# Patient Record
Sex: Female | Born: 1985 | Race: White | Hispanic: No | Marital: Single | Smoking: Current every day smoker
Health system: Southern US, Community
[De-identification: ages and names within clinical notes are randomized; demographics above are authoritative.]

## PROBLEM LIST (undated history)

## (undated) DIAGNOSIS — M5432 Sciatica, left side: Secondary | ICD-10-CM

## (undated) DIAGNOSIS — F840 Autistic disorder: Secondary | ICD-10-CM

## (undated) DIAGNOSIS — J45909 Unspecified asthma, uncomplicated: Secondary | ICD-10-CM

---

## 2015-05-14 ENCOUNTER — Emergency Department (HOSPITAL_COMMUNITY): Payer: Medicaid - Out of State

## 2015-05-14 ENCOUNTER — Encounter (HOSPITAL_COMMUNITY): Payer: Self-pay | Admitting: *Deleted

## 2015-05-14 ENCOUNTER — Emergency Department (HOSPITAL_COMMUNITY)
Admission: EM | Admit: 2015-05-14 | Discharge: 2015-05-15 | Disposition: A | Discharge status: Home / Self Care | Payer: Medicaid - Out of State | Attending: Emergency Medicine | Admitting: Emergency Medicine

## 2015-05-14 DIAGNOSIS — R109 Unspecified abdominal pain: Secondary | ICD-10-CM | POA: Insufficient documentation

## 2015-05-14 DIAGNOSIS — M545 Low back pain, unspecified: Secondary | ICD-10-CM

## 2015-05-14 DIAGNOSIS — Y999 Unspecified external cause status: Secondary | ICD-10-CM | POA: Diagnosis not present

## 2015-05-14 DIAGNOSIS — Y929 Unspecified place or not applicable: Secondary | ICD-10-CM | POA: Diagnosis not present

## 2015-05-14 DIAGNOSIS — Y939 Activity, unspecified: Secondary | ICD-10-CM | POA: Insufficient documentation

## 2015-05-14 DIAGNOSIS — Z7951 Long term (current) use of inhaled steroids: Secondary | ICD-10-CM | POA: Diagnosis not present

## 2015-05-14 DIAGNOSIS — M79671 Pain in right foot: Secondary | ICD-10-CM

## 2015-05-14 DIAGNOSIS — F17200 Nicotine dependence, unspecified, uncomplicated: Secondary | ICD-10-CM | POA: Insufficient documentation

## 2015-05-14 DIAGNOSIS — M549 Dorsalgia, unspecified: Secondary | ICD-10-CM | POA: Diagnosis present

## 2015-05-14 HISTORY — DX: Autistic disorder: F84.0

## 2015-05-14 HISTORY — DX: Sciatica, left side: M54.32

## 2015-05-14 LAB — CBC
HEMATOCRIT: 36.3 % (ref 36.0–46.0)
HEMOGLOBIN: 12 g/dL (ref 12.0–15.0)
MCH: 29.7 pg (ref 26.0–34.0)
MCHC: 33.1 g/dL (ref 30.0–36.0)
MCV: 89.9 fL (ref 78.0–100.0)
Platelets: 266 10*3/uL (ref 150–400)
RBC: 4.04 MIL/uL (ref 3.87–5.11)
RDW: 13.6 % (ref 11.5–15.5)
WBC: 7.4 10*3/uL (ref 4.0–10.5)

## 2015-05-14 LAB — COMPREHENSIVE METABOLIC PANEL
ALT: 12 U/L — ABNORMAL LOW (ref 14–54)
ANION GAP: 8 (ref 5–15)
AST: 11 U/L — AB (ref 15–41)
Albumin: 4.2 g/dL (ref 3.5–5.0)
Alkaline Phosphatase: 55 U/L (ref 38–126)
BILIRUBIN TOTAL: 0 mg/dL — AB (ref 0.3–1.2)
BUN: 11 mg/dL (ref 6–20)
CALCIUM: 8.9 mg/dL (ref 8.9–10.3)
CO2: 26 mmol/L (ref 22–32)
Chloride: 105 mmol/L (ref 101–111)
Creatinine, Ser: 0.78 mg/dL (ref 0.44–1.00)
GFR calc Af Amer: >60 mL/min (ref >60)
GFR calc non Af Amer: >60 mL/min (ref >60)
Glucose, Bld: 84 mg/dL (ref 65–99)
Potassium: 3.7 mmol/L (ref 3.5–5.1)
SODIUM: 139 mmol/L (ref 135–145)
TOTAL PROTEIN: 7 g/dL (ref 6.5–8.1)

## 2015-05-14 LAB — URINALYSIS, ROUTINE W REFLEX MICROSCOPIC
BILIRUBIN URINE: NEGATIVE
Glucose, UA: NEGATIVE mg/dL
KETONES UR: NEGATIVE mg/dL
Leukocytes, UA: NEGATIVE
NITRITE: NEGATIVE
PH: 6 (ref 5.0–8.0)
SPECIFIC GRAVITY, URINE: 1.015 (ref 1.005–1.030)

## 2015-05-14 LAB — PREGNANCY, URINE: Preg Test, Ur: NEGATIVE

## 2015-05-14 LAB — URINE MICROSCOPIC-ADD ON

## 2015-05-14 LAB — LIPASE, BLOOD: Lipase: 26 U/L (ref 11–51)
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
## 2015-05-15 MED ORDER — DIAZEPAM 5 MG PO TABS
5.0000 mg | ORAL_TABLET | Freq: Once | ORAL | Status: AC
  Administered 2015-05-15: 5 mg via ORAL
  Filled 2015-05-15: qty 1

## 2015-05-15 MED ORDER — CYCLOBENZAPRINE HCL 10 MG PO TABS: 10.0000 mg | ORAL_TABLET | Freq: Three times a day (TID) | ORAL | Status: DC

## 2015-05-15 MED ORDER — ACETAMINOPHEN 500 MG PO TABS
1000.0000 mg | ORAL_TABLET | Freq: Once | ORAL | Status: AC
  Administered 2015-05-15: 250 mg via ORAL
  Filled 2015-05-15: qty 2

## 2015-05-15 MED ORDER — PROMETHAZINE HCL 12.5 MG PO TABS: 12.5000 mg | ORAL_TABLET | Freq: Four times a day (QID) | ORAL | Status: DC | PRN

## 2015-05-15 MED ORDER — TIZANIDINE HCL 4 MG PO CAPS: 4.0000 mg | ORAL_CAPSULE | Freq: Two times a day (BID) | ORAL | Status: DC

## 2015-05-15 MED ORDER — DEXAMETHASONE 4 MG PO TABS: 4.0000 mg | ORAL_TABLET | Freq: Two times a day (BID) | ORAL | Status: DC

## 2015-05-15 MED ORDER — CYCLOBENZAPRINE HCL 10 MG PO TABS
10.0000 mg | ORAL_TABLET | Freq: Once | ORAL | Status: DC
  Filled 2015-05-15: qty 1

## 2015-05-15 MED ORDER — PROMETHAZINE HCL 12.5 MG PO TABS
12.5000 mg | ORAL_TABLET | Freq: Once | ORAL | Status: AC
  Administered 2015-05-15: 12.5 mg via ORAL
  Filled 2015-05-15: qty 1

## 2015-05-15 MED ORDER — CEPHALEXIN 500 MG PO CAPS: 500.0000 mg | ORAL_CAPSULE | Freq: Four times a day (QID) | ORAL | Status: DC

## 2015-05-15 NOTE — Discharge Instructions (Signed)
Your vital signs are well within normal limits. The x-ray of your foot is negative for fracture, dislocation, or effusion. Your labs are negative for acute changes, except for some abnormality of your urine. A culture has been sent to the lab. Please see Dr. August Saucerean, or the orthopedic specialist of your choice for additional evaluation concerning your back and your foot. Please use Flexeril 3 times daily for spasm pain, please use Decadron 2 times daily with a meal for inflammation, and use Tylenol every 4 hours as needed. Use Keflex four times daily for the abnormality of your urine test. Musculoskeletal Pain Musculoskeletal pain is muscle and boney aches and pains. These pains can occur in any part of the body. Your caregiver may treat you without knowing the cause of the pain. They may treat you if blood or urine tests, X-rays, and other tests were normal.  CAUSES There is often not a definite cause or reason for these pains. These pains may be caused by a type of germ (virus). The discomfort may also come from overuse. Overuse includes working out too hard when your body is not fit. Boney aches also come from weather changes. Bone is sensitive to atmospheric pressure changes. HOME CARE INSTRUCTIONS   Ask when your test results will be ready. Make sure you get your test results.  Only take over-the-counter or prescription medicines for pain, discomfort, or fever as directed by your caregiver. If you were given medications for your condition, do not drive, operate machinery or power tools, or sign legal documents for 24 hours. Do not drink alcohol. Do not take sleeping pills or other medications that may interfere with treatment.  Continue all activities unless the activities cause more pain. When the pain lessens, slowly resume normal activities. Gradually increase the intensity and duration of the activities or exercise.  During periods of severe pain, bed rest may be helpful. Lay or sit in any  position that is comfortable.  Putting ice on the injured area.  Put ice in a bag.  Place a towel between your skin and the bag.  Leave the ice on for 15 to 20 minutes, 3 to 4 times a day.  Follow up with your caregiver for continued problems and no reason can be found for the pain. If the pain becomes worse or does not go away, it may be necessary to repeat tests or do additional testing. Your caregiver may need to look further for a possible cause. SEEK IMMEDIATE MEDICAL CARE IF:  You have pain that is getting worse and is not relieved by medications.  You develop chest pain that is associated with shortness or breath, sweating, feeling sick to your stomach (nauseous), or throw up (vomit).  Your pain becomes localized to the abdomen.  You develop any new symptoms that seem different or that concern you. MAKE SURE YOU:   Understand these instructions.  Will watch your condition.  Will get help right away if you are not doing well or get worse.   This information is not intended to replace advice given to you by your health care provider. Make sure you discuss any questions you have with your health care provider.   Document Released: 01/09/2005 Document Revised: 04/03/2011 Document Reviewed: 09/13/2012 Elsevier Interactive Patient Education [employer] Elsevier Inc. as needed.

## 2015-05-15 NOTE — ED Notes (Signed)
EDPA aware that pt has multiple medication questions

## 2015-05-17 LAB — URINE CULTURE: SPECIAL REQUESTS: NORMAL

## 2015-05-23 ENCOUNTER — Encounter (HOSPITAL_COMMUNITY): Payer: Self-pay | Admitting: Emergency Medicine

## 2015-05-23 ENCOUNTER — Emergency Department (HOSPITAL_COMMUNITY)
Admission: EM | Admit: 2015-05-23 | Discharge: 2015-05-23 | Disposition: A | Discharge status: Home / Self Care | Payer: No Typology Code available for payment source | Attending: Emergency Medicine | Admitting: Emergency Medicine

## 2015-05-23 DIAGNOSIS — M5432 Sciatica, left side: Secondary | ICD-10-CM

## 2015-05-23 DIAGNOSIS — F17200 Nicotine dependence, unspecified, uncomplicated: Secondary | ICD-10-CM | POA: Insufficient documentation

## 2015-05-23 DIAGNOSIS — M5442 Lumbago with sciatica, left side: Secondary | ICD-10-CM | POA: Insufficient documentation

## 2015-05-23 MED ORDER — DIAZEPAM 2 MG PO TABS
2.0000 mg | ORAL_TABLET | Freq: Two times a day (BID) | ORAL | Status: AC | PRN
Start: 2015-05-23 — End: ?

## 2015-05-23 MED ORDER — DIAZEPAM 5 MG PO TABS
5.0000 mg | ORAL_TABLET | Freq: Once | ORAL | Status: AC
  Administered 2015-05-23: 5 mg via ORAL
  Filled 2015-05-23: qty 1

## 2015-05-23 MED ORDER — DIAZEPAM 2 MG PO TABS: 2.0000 mg | ORAL_TABLET | Freq: Four times a day (QID) | ORAL | Status: DC | PRN

## 2015-05-23 MED ORDER — PREDNISONE 20 MG PO TABS
40.0000 mg | ORAL_TABLET | Freq: Once | ORAL | Status: AC
  Administered 2015-05-23: 40 mg via ORAL
  Filled 2015-05-23: qty 2

## 2015-05-23 NOTE — Discharge Instructions (Signed)
Get your prescription for the prednisone filled and follow up with Dr. Romeo Apple.  Sciatica Sciatica is pain, weakness, numbness, or tingling along the path of the sciatic nerve. The nerve starts in the lower back and runs down the back of each leg. The nerve controls the muscles in the lower leg and in the back of the knee, while also providing sensation to the back of the thigh, lower leg, and the sole of your foot. Sciatica is a symptom of another medical condition. For instance, nerve damage or certain conditions, such as a herniated disk or bone spur on the spine, pinch or put pressure on the sciatic nerve. This causes the pain, weakness, or other sensations normally associated with sciatica. Generally, sciatica only affects one side of the body. CAUSES   Herniated or slipped disc.  Degenerative disk disease.  A pain disorder involving the narrow muscle in the buttocks (piriformis syndrome).  Pelvic injury or fracture.  Pregnancy.  Tumor (rare). SYMPTOMS  Symptoms can vary from mild to very severe. The symptoms usually travel from the low back to the buttocks and down the back of the leg. Symptoms can include:  Mild tingling or dull aches in the lower back, leg, or hip.  Numbness in the back of the calf or sole of the foot.  Burning sensations in the lower back, leg, or hip.  Sharp pains in the lower back, leg, or hip.  Leg weakness.  Severe back pain inhibiting movement. These symptoms may get worse with coughing, sneezing, laughing, or prolonged sitting or standing. Also, being overweight may worsen symptoms. DIAGNOSIS  Your caregiver will perform a physical exam to look for common symptoms of sciatica. He or she may ask you to do certain movements or activities that would trigger sciatic nerve pain. Other tests may be performed to find the cause of the sciatica. These may include:  Blood tests.  X-rays.  Imaging tests, such as an MRI or CT scan. TREATMENT  Treatment is  directed at the cause of the sciatic pain. Sometimes, treatment is not necessary and the pain and discomfort goes away on its own. If treatment is needed, your caregiver may suggest:  Over-the-counter medicines to relieve pain.  Prescription medicines, such as anti-inflammatory medicine, muscle relaxants, or narcotics.  Applying heat or ice to the painful area.  Steroid injections to lessen pain, irritation, and inflammation around the nerve.  Reducing activity during periods of pain.  Exercising and stretching to strengthen your abdomen and improve flexibility of your spine. Your caregiver may suggest losing weight if the extra weight makes the back pain worse.  Physical therapy.  Surgery to eliminate what is pressing or pinching the nerve, such as a bone spur or part of a herniated disk. HOME CARE INSTRUCTIONS   Only take over-the-counter or prescription medicines for pain or discomfort as directed by your caregiver.  Apply ice to the affected area for 20 minutes, 3-4 times a day for the first 48-72 hours. Then try heat in the same way.  Exercise, stretch, or perform your usual activities if these do not aggravate your pain.  Attend physical therapy sessions as directed by your caregiver.  Keep all follow-up appointments as directed by your caregiver.  Do not wear high heels or shoes that do not provide proper support.  Check your mattress to see if it is too soft. A firm mattress may lessen your pain and discomfort. SEEK IMMEDIATE MEDICAL CARE IF:   You lose control of your bowel  or bladder (incontinence).  You have increasing weakness in the lower back, pelvis, buttocks, or legs.  You have redness or swelling of your back.  You have a burning sensation when you urinate.  You have pain that gets worse when you lie down or awakens you at night.  Your pain is worse than you have experienced in the past.  Your pain is lasting longer than 4 weeks.  You are suddenly  losing weight without reason. MAKE SURE YOU:  Understand these instructions.  Will watch your condition.  Will get help right away if you are not doing well or get worse.   This information is not intended to replace advice given to you by your health care provider. Make sure you discuss any questions you have with your health care provider.   Document Released: 01/03/2001 Document Revised: 09/30/2014 Document Reviewed: 05/21/2011 Elsevier Interactive Patient Education [employer]2016 Elsevier Inc.

## 2015-05-23 NOTE — ED Notes (Signed)
Pt made aware to return if symptoms worsen or if any life threatening symptoms occur.   
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
## 2015-06-14 ENCOUNTER — Emergency Department (HOSPITAL_COMMUNITY): Payer: Medicaid - Out of State

## 2015-06-14 ENCOUNTER — Encounter (HOSPITAL_COMMUNITY): Payer: Self-pay | Admitting: Emergency Medicine

## 2015-06-14 ENCOUNTER — Emergency Department (HOSPITAL_COMMUNITY)
Admission: EM | Admit: 2015-06-14 | Discharge: 2015-06-14 | Disposition: A | Discharge status: Home / Self Care | Payer: Medicaid - Out of State | Attending: Emergency Medicine | Admitting: Emergency Medicine

## 2015-06-14 DIAGNOSIS — Z79899 Other long term (current) drug therapy: Secondary | ICD-10-CM | POA: Diagnosis not present

## 2015-06-14 DIAGNOSIS — J302 Other seasonal allergic rhinitis: Secondary | ICD-10-CM | POA: Diagnosis not present

## 2015-06-14 DIAGNOSIS — Z79891 Long term (current) use of opiate analgesic: Secondary | ICD-10-CM | POA: Diagnosis not present

## 2015-06-14 DIAGNOSIS — F17210 Nicotine dependence, cigarettes, uncomplicated: Secondary | ICD-10-CM | POA: Diagnosis not present

## 2015-06-14 DIAGNOSIS — J40 Bronchitis, not specified as acute or chronic: Secondary | ICD-10-CM

## 2015-06-14 DIAGNOSIS — R0602 Shortness of breath: Secondary | ICD-10-CM | POA: Diagnosis present

## 2015-06-14 DIAGNOSIS — T7840XA Allergy, unspecified, initial encounter: Secondary | ICD-10-CM

## 2015-06-14 HISTORY — DX: Unspecified asthma, uncomplicated: J45.909

## 2015-06-14 LAB — RAPID URINE DRUG SCREEN, HOSP PERFORMED
Amphetamines: NOT DETECTED
Barbiturates: NOT DETECTED
Benzodiazepines: POSITIVE — AB
Cocaine: NOT DETECTED
OPIATES: NOT DETECTED
Tetrahydrocannabinol: NOT DETECTED

## 2015-06-14 MED ORDER — PREDNISONE 10 MG PO TABS: 40.0000 mg | ORAL_TABLET | Freq: Every day | ORAL | Status: DC

## 2015-06-14 MED ORDER — ACETAMINOPHEN 325 MG PO TABS
650.0000 mg | ORAL_TABLET | Freq: Once | ORAL | Status: AC
  Administered 2015-06-14: 650 mg via ORAL
  Filled 2015-06-14: qty 2

## 2015-06-14 MED ORDER — PREDNISONE 10 MG PO TABS
60.0000 mg | ORAL_TABLET | Freq: Once | ORAL | Status: AC
  Administered 2015-06-14: 60 mg via ORAL
  Filled 2015-06-14: qty 1
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
## 2015-06-14 NOTE — ED Provider Notes (Signed)
CSN: [id number]     Arrival date & time 06/14/15  1112 History   First MD Initiated Contact with Patient 06/14/15 1517     Chief Complaint  Patient presents with  . Shortness of Breath     (Consider location/radiation/quality/duration/timing/severity/associated sxs/prior Treatment) Patient is a 30 y.o. female presenting with shortness of breath. The history is provided by the patient.  Shortness of Breath Associated symptoms: cough   Associated symptoms: no abdominal pain, no chest pain, no fever, no headaches and no rash   Patient presents with shortness of breath and cough feeling like throat is going to close off at times. Patient this morning had the tight feeling in her throat felt short of breath and difficulty breathing. Patient is in a shelter that she thinks has black mold. Patient known to have seasonal allergies. Patient seen a week ago at [facility] started on albuterol inhaler and Claritin but not able to get the Claritin filled. Patient also not using the albuterol inhaler on a regular basis. Patient's been on Flonase in the past for allergy symptoms and she is using that sporadically as well. Patient denies any upper respiratory symptoms no productive cough no fevers. No sore throat. No hives or rash.  Past Medical History  Diagnosis Date  . Sciatica, left side   . Autism spectrum   . Asthma    History reviewed. No pertinent past surgical history. No family history on file. Social History  Substance Use Topics  . Smoking status: Current Every Day Smoker    Types: Cigarettes  . Smokeless tobacco: None  . Alcohol Use: No   OB History    No data available     Review of Systems  Constitutional: Negative for fever.  HENT: Negative for congestion.   Eyes: Negative for visual disturbance.  Respiratory: Positive for cough. Negative for shortness of breath.   Cardiovascular: Negative for chest pain.  Gastrointestinal: Negative for abdominal pain.   Genitourinary: Negative for dysuria.  Musculoskeletal: Negative for back pain.  Skin: Negative for rash.  Allergic/Immunologic: Positive for environmental allergies.  Neurological: Negative for headaches.  Hematological: Does not bruise/bleed easily.  Psychiatric/Behavioral: Negative for confusion.      Allergies  Nsaids  Home Medications   Prior to Admission medications   Medication Sig Start Date End Date Taking? Authorizing Provider  Cholecalciferol (VITAMIN D PO) Take 1 tablet by mouth daily.   Yes Historical Provider, MD  CINNAMON PO Take 1 tablet by mouth daily.   Yes Historical Provider, MD  diazepam (VALIUM) 2 MG tablet Take 1 tablet (2 mg total) by mouth every 12 (twelve) hours as needed for anxiety. 05/23/15  Yes Hope Orlene Och, NP  fluticasone (FLONASE) 50 MCG/ACT nasal spray Place 1 spray into both nostrils daily as needed for allergies.    Yes Historical Provider, MD  HYDROcodone-acetaminophen (NORCO/VICODIN) 5-325 MG tablet Take 1 tablet by mouth every 6 (six) hours as needed for moderate pain. Reported on 06/14/2015   Yes Historical Provider, MD  Melatonin 3 MG CAPS Take 3 mg by mouth at bedtime.   Yes Historical Provider, MD  promethazine (PHENERGAN) 12.5 MG tablet Take 1 tablet (12.5 mg total) by mouth every 6 (six) hours as needed for nausea or vomiting. 05/15/15  Yes Ivery Quale, PA-C  tiZANidine (ZANAFLEX) 4 MG capsule Take 1 capsule (4 mg total) by mouth 2 (two) times daily. Patient taking differently: Take 4 mg by mouth at bedtime.  05/15/15  Yes Ivery Quale, PA-C  VITAMIN A PO Take 1 tablet by mouth daily.   Yes Historical Provider, MD  cephALEXin (KEFLEX) 500 MG capsule Take 1 capsule (500 mg total) by mouth 4 (four) times daily. Patient not taking: Reported on 06/14/2015 05/15/15   Ivery QualeHobson Bryant, PA-C  cyclobenzaprine (FLEXERIL) 10 MG tablet Take 1 tablet (10 mg total) by mouth 3 (three) times daily. Patient not taking: Reported on 06/14/2015 05/15/15   Ivery QualeHobson  Bryant, PA-C  dexamethasone (DECADRON) 4 MG tablet Take 1 tablet (4 mg total) by mouth 2 (two) times daily with a meal. Patient not taking: Reported on 06/14/2015 05/15/15   Ivery QualeHobson Bryant, PA-C  predniSONE (DELTASONE) 10 MG tablet Take 4 tablets (40 mg total) by mouth daily. 06/14/15   Vanetta MuldersScott Khaylee Mcevoy, MD   BP 134/82 mmHg  Pulse 85  Temp(Src) 97.8 F (36.6 C) (Oral)  Resp 16  Ht 5\' 11"  (1.803 m)  Wt 97.07 kg  BMI 29.86 kg/m2  SpO2 99%  LMP 06/12/2015 Physical Exam  Constitutional: She is oriented to person, place, and time. She appears well-developed and well-nourished. No distress.  HENT:  Head: Normocephalic and atraumatic.  Mouth/Throat: Oropharynx is clear and moist.  Eyes: Conjunctivae and EOM are normal. Pupils are equal, round, and reactive to light.  Neck: Normal range of motion. Neck supple.  Cardiovascular: Normal rate, regular rhythm and normal heart sounds.   No murmur heard. Pulmonary/Chest: Effort normal and breath sounds normal. No respiratory distress. She has no wheezes. She has no rales.  Abdominal: Soft. Bowel sounds are normal. There is no tenderness.  Musculoskeletal: Normal range of motion.  Neurological: She is alert and oriented to person, place, and time. No cranial nerve deficit. She exhibits normal muscle tone. Coordination normal.  Skin: Skin is warm. No rash noted.  Nursing note and vitals reviewed.   ED Course  Procedures (including critical care time) Labs Review Labs Reviewed  URINE RAPID DRUG SCREEN, HOSP PERFORMED - Abnormal; Notable for the following:    Benzodiazepines POSITIVE (*)    All other components within normal limits    Imaging Review Dg Chest 2 View  06/14/2015  CLINICAL DATA:  Shortness of breath, mold exposure EXAM: CHEST  2 VIEW COMPARISON:  None. FINDINGS: The heart size and mediastinal contours are within normal limits. Both lungs are clear. The visualized skeletal structures are unremarkable. IMPRESSION: No active  cardiopulmonary disease. Electronically Signed   By: Esperanza Heiraymond  Rubner M.D.   On: 06/14/2015 11:53   I have personally reviewed and evaluated these images and lab results as part of my medical decision-making.   EKG Interpretation None      MDM   Final diagnoses:  Bronchitis  Allergy, initial encounter   Patient was symptoms consistent with seasonal allergy bronchitis. Reactive airway disease. No wheezing here no acute distress. Chest x-ray negative for pneumonia pneumothorax or pulmonary edema. Oxygen saturation are 100% on room air.   Sounds as if patient has seasonal allergy problems with pollen and may be at a residence that has mold issues. [facility] and started on albuterol inhaler and given a prescription for Claritin which she has not filled. Patient is not using the albuterol on a regular basis. She is taking 4 puffs at a time. Patient told to start 2 puffs every 6 hours to get help getting her Claritin filled and will start her on prednisone. Patient uses a nasal spray intermittently which appears to be Flonase. She is not using that  on a daily basis will have her hold on that while she's on a short course of prednisone.   Vanetta Mulders, MD 06/14/15 [phone]

## 2015-06-14 NOTE — Discharge Instructions (Signed)
Allergies An allergy is when your body reacts to a substance in a way that is not normal. An allergic reaction can happen after you:  Eat something.  Breathe in something.  Touch something. WHAT KINDS OF ALLERGIES ARE THERE? You can be allergic to:  Things that are only around during certain seasons, like molds and pollens.  Foods.  Drugs.  Insects.  Animal dander. WHAT ARE SYMPTOMS OF ALLERGIES?  Puffiness (swelling). This may happen on the lips, face, tongue, mouth, or throat.  Sneezing.  Coughing.  Breathing loudly (wheezing).  Stuffy nose.  Tingling in the mouth.  A rash.  Itching.  Itchy, red, puffy areas of skin (hives).  Watery eyes.  Throwing up (vomiting).  Watery poop (diarrhea).  Dizziness.  Feeling faint or fainting.  Trouble breathing or swallowing.  A tight feeling in the chest.  A fast heartbeat. HOW ARE ALLERGIES DIAGNOSED? Allergies can be diagnosed with:  A medical and family history.  Skin tests.  Blood tests.  A food diary. A food diary is a record of all the foods, drinks, and symptoms you have each day.  The results of an elimination diet. This diet involves making sure not to eat certain foods and then seeing what happens when you start eating them again. HOW ARE ALLERGIES TREATED? There is no cure for allergies, but allergic reactions can be treated with medicine. Severe reactions usually need to be treated at a hospital.  HOW CAN REACTIONS BE PREVENTED? The best way to prevent an allergic reaction is to avoid the thing you are allergic to. Allergy shots and medicines can also help prevent reactions in some cases.   This information is not intended to replace advice given to you by your health care provider. Make sure you discuss any questions you have with your health care provider.     Continue usual albuterol inhaler 2 puffs every 6 hours. Recommend that we'll go and start you on prednisone which is a more potent  for helping with allergies. You can also take the Claritin with it. If you're taking the prednisone you can hold on your nasal spray. Return for any new or worse symptoms.   Document Released: 05/06/2012 Document Revised: 01/30/2014 Document Reviewed: 10/21/2013 Elsevier Interactive Patient Education [employer]2016 [employer] Inc[employer]

## 2015-06-27 ENCOUNTER — Emergency Department (HOSPITAL_COMMUNITY): Payer: Medicaid - Out of State

## 2015-06-27 ENCOUNTER — Inpatient Hospital Stay (HOSPITAL_COMMUNITY)
Admission: EM | Admit: 2015-06-27 | Discharge: 2015-07-02 | DRG: 392 | Disposition: A | Discharge status: Home / Self Care | Payer: Medicaid - Out of State | Attending: Internal Medicine | Admitting: Internal Medicine

## 2015-06-27 ENCOUNTER — Encounter (HOSPITAL_COMMUNITY): Payer: Self-pay

## 2015-06-27 DIAGNOSIS — E663 Overweight: Secondary | ICD-10-CM | POA: Diagnosis present

## 2015-06-27 DIAGNOSIS — K529 Noninfective gastroenteritis and colitis, unspecified: Principal | ICD-10-CM | POA: Diagnosis present

## 2015-06-27 DIAGNOSIS — F17210 Nicotine dependence, cigarettes, uncomplicated: Secondary | ICD-10-CM | POA: Diagnosis present

## 2015-06-27 DIAGNOSIS — Z7951 Long term (current) use of inhaled steroids: Secondary | ICD-10-CM

## 2015-06-27 DIAGNOSIS — F419 Anxiety disorder, unspecified: Secondary | ICD-10-CM | POA: Diagnosis present

## 2015-06-27 DIAGNOSIS — J45909 Unspecified asthma, uncomplicated: Secondary | ICD-10-CM | POA: Diagnosis present

## 2015-06-27 DIAGNOSIS — Z6830 Body mass index (BMI) 30.0-30.9, adult: Secondary | ICD-10-CM

## 2015-06-27 DIAGNOSIS — E876 Hypokalemia: Secondary | ICD-10-CM | POA: Diagnosis present

## 2015-06-27 DIAGNOSIS — F840 Autistic disorder: Secondary | ICD-10-CM | POA: Diagnosis present

## 2015-06-27 LAB — BASIC METABOLIC PANEL
Anion gap: 6 (ref 5–15)
BUN: 8 mg/dL (ref 6–20)
CHLORIDE: 104 mmol/L (ref 101–111)
CO2: 26 mmol/L (ref 22–32)
CREATININE: 0.65 mg/dL (ref 0.44–1.00)
Calcium: 9 mg/dL (ref 8.9–10.3)
GFR calc Af Amer: >60 mL/min (ref >60)
GFR calc non Af Amer: >60 mL/min (ref >60)
GLUCOSE: 106 mg/dL — AB (ref 65–99)
Potassium: 3.5 mmol/L (ref 3.5–5.1)
Sodium: 136 mmol/L (ref 135–145)

## 2015-06-27 LAB — URINE MICROSCOPIC-ADD ON

## 2015-06-27 LAB — PREGNANCY, URINE: Preg Test, Ur: NEGATIVE

## 2015-06-27 LAB — CBC WITH DIFFERENTIAL/PLATELET
Basophils Absolute: 0 10*3/uL (ref 0.0–0.1)
Basophils Relative: 0 %
EOS ABS: 0 10*3/uL (ref 0.0–0.7)
Eosinophils Relative: 0 %
HEMATOCRIT: 35.9 % — AB (ref 36.0–46.0)
HEMOGLOBIN: 11.7 g/dL — AB (ref 12.0–15.0)
LYMPHS ABS: 1.6 10*3/uL (ref 0.7–4.0)
LYMPHS PCT: 11 %
MCH: 29.4 pg (ref 26.0–34.0)
MCHC: 32.6 g/dL (ref 30.0–36.0)
MCV: 90.2 fL (ref 78.0–100.0)
MONOS PCT: 8 %
Monocytes Absolute: 1.1 10*3/uL — ABNORMAL HIGH (ref 0.1–1.0)
NEUTROS PCT: 81 %
Neutro Abs: 11 10*3/uL — ABNORMAL HIGH (ref 1.7–7.7)
Platelets: 250 10*3/uL (ref 150–400)
RBC: 3.98 MIL/uL (ref 3.87–5.11)
RDW: 13.7 % (ref 11.5–15.5)
WBC: 13.7 10*3/uL — ABNORMAL HIGH (ref 4.0–10.5)

## 2015-06-27 LAB — URINALYSIS, ROUTINE W REFLEX MICROSCOPIC
BILIRUBIN URINE: NEGATIVE
Glucose, UA: NEGATIVE mg/dL
HGB URINE DIPSTICK: NEGATIVE
Ketones, ur: NEGATIVE mg/dL
Nitrite: NEGATIVE
PH: 6.5 (ref 5.0–8.0)
Protein, ur: NEGATIVE mg/dL

## 2015-06-27 MED ORDER — ONDANSETRON HCL 4 MG PO TABS
4.0000 mg | ORAL_TABLET | Freq: Four times a day (QID) | ORAL | Status: DC | PRN
  Administered 2015-06-28 – 2015-07-02 (×3): 4 mg via ORAL
  Filled 2015-06-27 (×4): qty 1

## 2015-06-27 MED ORDER — METRONIDAZOLE IN NACL 5-0.79 MG/ML-% IV SOLN
500.0000 mg | Freq: Three times a day (TID) | INTRAVENOUS | Status: DC
  Administered 2015-06-28 – 2015-07-01 (×10): 500 mg via INTRAVENOUS
  Filled 2015-06-27 (×11): qty 100

## 2015-06-27 MED ORDER — DEXTROSE 5 % IV SOLN: 1.0000 g | INTRAVENOUS | Status: DC

## 2015-06-27 MED ORDER — SODIUM CHLORIDE 0.9 % IV SOLN: INTRAVENOUS | Status: DC

## 2015-06-27 MED ORDER — DIATRIZOATE MEGLUMINE & SODIUM 66-10 % PO SOLN
ORAL | Status: AC
  Filled 2015-06-27: qty 30

## 2015-06-27 MED ORDER — ACETAMINOPHEN 325 MG PO TABS
650.0000 mg | ORAL_TABLET | Freq: Four times a day (QID) | ORAL | Status: DC | PRN
Start: 2015-06-27 — End: 2015-07-02
  Administered 2015-07-02: 325 mg via ORAL
  Filled 2015-06-27: qty 2

## 2015-06-27 MED ORDER — DEXTROSE 5 % IV SOLN
1.0000 g | Freq: Once | INTRAVENOUS | Status: AC
  Administered 2015-06-27: 1 g via INTRAVENOUS
  Filled 2015-06-27: qty 10

## 2015-06-27 MED ORDER — ONDANSETRON HCL 4 MG/2ML IJ SOLN
4.0000 mg | Freq: Once | INTRAMUSCULAR | Status: AC
  Administered 2015-06-27: 4 mg via INTRAVENOUS
  Filled 2015-06-27: qty 2

## 2015-06-27 MED ORDER — IOPAMIDOL (ISOVUE-300) INJECTION 61%
100.0000 mL | Freq: Once | INTRAVENOUS | Status: AC | PRN
Start: 2015-06-27 — End: 2015-06-27
  Administered 2015-06-27: 100 mL via INTRAVENOUS

## 2015-06-27 MED ORDER — HYDROMORPHONE HCL 1 MG/ML IJ SOLN
0.5000 mg | Freq: Once | INTRAMUSCULAR | Status: AC
Start: 2015-06-27 — End: 2015-06-27
  Administered 2015-06-27: 0.5 mg via INTRAVENOUS
  Filled 2015-06-27: qty 1

## 2015-06-27 MED ORDER — DEXTROSE 5 % IV SOLN
INTRAVENOUS | Status: AC
  Filled 2015-06-27: qty 10

## 2015-06-27 MED ORDER — SODIUM CHLORIDE 0.9 % IV BOLUS (SEPSIS)
1000.0000 mL | Freq: Once | INTRAVENOUS | Status: AC
  Administered 2015-06-27: 1000 mL via INTRAVENOUS

## 2015-06-27 MED ORDER — PROMETHAZINE HCL 25 MG/ML IJ SOLN
12.5000 mg | Freq: Once | INTRAMUSCULAR | Status: AC
  Administered 2015-06-27: 12.5 mg via INTRAVENOUS
  Filled 2015-06-27: qty 1

## 2015-06-27 MED ORDER — ZOLPIDEM TARTRATE 5 MG PO TABS
5.0000 mg | ORAL_TABLET | Freq: Once | ORAL | Status: AC
  Administered 2015-06-27: 5 mg via ORAL
  Filled 2015-06-27: qty 1

## 2015-06-27 MED ORDER — DEXTROSE 5 % IV SOLN
2.0000 g | INTRAVENOUS | Status: DC
  Administered 2015-06-28 – 2015-06-30 (×3): 2 g via INTRAVENOUS
  Filled 2015-06-27 (×7): qty 2

## 2015-06-27 MED ORDER — SODIUM CHLORIDE 0.9 % IV SOLN
INTRAVENOUS | Status: DC
  Administered 2015-06-27 – 2015-06-29 (×2): via INTRAVENOUS

## 2015-06-27 MED ORDER — ONDANSETRON HCL 4 MG/2ML IJ SOLN
4.0000 mg | Freq: Four times a day (QID) | INTRAMUSCULAR | Status: DC | PRN
  Administered 2015-06-27 – 2015-07-01 (×13): 4 mg via INTRAVENOUS
  Filled 2015-06-27 (×14): qty 2

## 2015-06-27 MED ORDER — MORPHINE SULFATE (PF) 4 MG/ML IV SOLN
4.0000 mg | Freq: Once | INTRAVENOUS | Status: AC
  Administered 2015-06-27: 4 mg via INTRAVENOUS
  Filled 2015-06-27: qty 1

## 2015-06-27 MED ORDER — METRONIDAZOLE IN NACL 5-0.79 MG/ML-% IV SOLN
500.0000 mg | Freq: Once | INTRAVENOUS | Status: AC
  Administered 2015-06-27: 500 mg via INTRAVENOUS
  Filled 2015-06-27: qty 100

## 2015-06-27 MED ORDER — ACETAMINOPHEN 650 MG RE SUPP: 650.0000 mg | Freq: Four times a day (QID) | RECTAL | Status: DC | PRN

## 2015-06-27 MED ORDER — MORPHINE SULFATE (PF) 2 MG/ML IV SOLN
2.0000 mg | INTRAVENOUS | Status: DC | PRN
  Administered 2015-06-27 – 2015-06-29 (×10): 2 mg via INTRAVENOUS
  Filled 2015-06-27 (×12): qty 1

## 2015-06-27 MED ORDER — ENOXAPARIN SODIUM 40 MG/0.4ML ~~LOC~~ SOLN
40.0000 mg | SUBCUTANEOUS | Status: DC
  Administered 2015-06-27 – 2015-07-01 (×5): 40 mg via SUBCUTANEOUS
  Filled 2015-06-27 (×5): qty 0.4

## 2015-06-27 NOTE — ED Provider Notes (Signed)
CSN: [id number]     Arrival date & time 06/27/15  1335 History   First MD Initiated Contact with Patient 06/27/15 1406     Chief Complaint  Patient presents with  . Abdominal Pain     (Consider location/radiation/quality/duration/timing/severity/associated sxs/prior Treatment) HPI   Joanna Flowers is a 30 y.o. female here for evaluation of worsening abdominal pain. She reports onset of mild abdominal pain yesterday which has been associated with nausea and anorexia. Initially the pain was in the right upper quadrant region. In the last 12-24 hours. The pain has migrated to the right lower quadrant. She had a normal bowel movement yesterday. She has not had any diarrhea. She denies vomiting, fever, chills, cough, chest pain, weakness or dizziness. She feels like she has a "immune disorder" because she has been living in a facility which has mold in it. No prior abdominal surgery. No known sick contacts. There are no other known modifying factors.   Past Medical History  Diagnosis Date  . Sciatica, left side   . Autism spectrum   . Asthma    History reviewed. No pertinent past surgical history. History reviewed. No pertinent family history. Social History  Substance Use Topics  . Smoking status: Current Every Day Smoker    Types: Cigarettes  . Smokeless tobacco: None  . Alcohol Use: No   OB History    No data available     Review of Systems  All other systems reviewed and are negative.     Allergies  Nsaids  Home Medications   Prior to Admission medications   Medication Sig Start Date End Date Taking? Authorizing Provider  Cholecalciferol (VITAMIN D PO) Take 1 tablet by mouth daily.   Yes Historical Provider, MD  CINNAMON PO Take 1 tablet by mouth daily.   Yes Historical Provider, MD  diazepam (VALIUM) 2 MG tablet Take 1 tablet (2 mg total) by mouth every 12 (twelve) hours as needed for anxiety. 05/23/15  Yes Hope Orlene OchM Neese, NP  fluticasone (FLONASE) 50 MCG/ACT nasal  spray Place 1 spray into both nostrils daily as needed for allergies.    Yes Historical Provider, MD  HYDROcodone-acetaminophen (NORCO/VICODIN) 5-325 MG tablet Take 1 tablet by mouth every 6 (six) hours as needed for moderate pain. Reported on 06/14/2015   Yes Historical Provider, MD  loratadine (CLARITIN) 10 MG tablet Take 10 mg by mouth daily. 06/15/15  Yes Historical Provider, MD  Melatonin 3 MG CAPS Take 3 mg by mouth at bedtime.   Yes Historical Provider, MD  PROAIR HFA 108 (90 Base) MCG/ACT inhaler INHALE 2 PUFFS BY MOUTH TWO TIMES DAILY AND AS NEEDED, MAX 4 TIMES DAILY 06/15/15  Yes Historical Provider, MD  promethazine (PHENERGAN) 12.5 MG tablet Take 1 tablet (12.5 mg total) by mouth every 6 (six) hours as needed for nausea or vomiting. 05/15/15  Yes Ivery QualeHobson Bryant, PA-C  tiZANidine (ZANAFLEX) 4 MG capsule Take 1 capsule (4 mg total) by mouth 2 (two) times daily. Patient taking differently: Take 4 mg by mouth at bedtime.  05/15/15  Yes Ivery QualeHobson Bryant, PA-C  traMADol (ULTRAM) 50 MG tablet Take 50 mg by mouth every 6 (six) hours as needed. 06/15/15  Yes Historical Provider, MD  VITAMIN A PO Take 1 tablet by mouth daily.   Yes Historical Provider, MD  cephALEXin (KEFLEX) 500 MG capsule Take 1 capsule (500 mg total) by mouth 4 (four) times daily. Patient not taking: Reported on 06/14/2015 05/15/15   Ivery QualeHobson Bryant, PA-C  cyclobenzaprine [facility](FLEXERIL)  10 MG tablet Take 1 tablet (10 mg total) by mouth 3 (three) times daily. Patient not taking: Reported on 06/14/2015 05/15/15   Ivery Quale, PA-C  dexamethasone (DECADRON) 4 MG tablet Take 1 tablet (4 mg total) by mouth 2 (two) times daily with a meal. Patient not taking: Reported on 06/14/2015 05/15/15   Ivery Quale, PA-C  predniSONE (DELTASONE) 10 MG tablet Take 4 tablets (40 mg total) by mouth daily. Patient not taking: Reported on 06/27/2015 06/14/15   Vanetta Mulders, MD   BP 92/52 mmHg  Pulse 90  Temp(Src) 99.1 F (37.3 C) (Oral)  Resp 20  Ht 5\' 11"   (1.803 m)  Wt 215 lb 6.2 oz (97.7 kg)  BMI 30.05 kg/m2  SpO2 100%  LMP 06/12/2015 Physical Exam  Constitutional: She is oriented to person, place, and time. She appears well-developed and well-nourished. She appears distressed (She is uncomfortable).  HENT:  Head: Normocephalic and atraumatic.  Right Ear: External ear normal.  Left Ear: External ear normal.  Eyes: Conjunctivae and EOM are normal. Pupils are equal, round, and reactive to light.  Neck: Normal range of motion and phonation normal. Neck supple.  Cardiovascular: Normal rate, regular rhythm and normal heart sounds.   Pulmonary/Chest: Effort normal and breath sounds normal. She exhibits no bony tenderness.  Abdominal: Soft. She exhibits distension. She exhibits no mass. There is tenderness (Moderate right lower quadrant tenderness, with significant guarding. Unable to assess for peritonitis in this region because she resists even light touch.). There is guarding.  Musculoskeletal: Normal range of motion.  Neurological: She is alert and oriented to person, place, and time. No cranial nerve deficit or sensory deficit. She exhibits normal muscle tone. Coordination normal.  Skin: Skin is warm, dry and intact.  Psychiatric: She has a normal mood and affect. Her behavior is normal. Judgment and thought content normal.  Nursing note and vitals reviewed.   ED Course  Procedures (including critical care time)   Medications  0.9 %  sodium chloride infusion ( Intravenous Stopped 06/27/15 1943)  diatrizoate meglumine-sodium (GASTROGRAFIN) 66-10 % solution (not administered)  enoxaparin (LOVENOX) injection 40 mg (40 mg Subcutaneous Given 06/27/15 2105)  acetaminophen (TYLENOL) tablet 650 mg (not administered)    Or  acetaminophen (TYLENOL) suppository 650 mg (not administered)  ondansetron (ZOFRAN) tablet 4 mg ( Oral See Alternative 06/28/15 0317)    Or  ondansetron (ZOFRAN) injection 4 mg (4 mg Intravenous Given 06/28/15 0317)   metroNIDAZOLE (FLAGYL) IVPB 500 mg (500 mg Intravenous Given 06/28/15 1034)  morphine 2 MG/ML injection 2 mg (2 mg Intravenous Given 06/28/15 0854)  cefTRIAXone (ROCEPHIN) 2 g in dextrose 5 % 50 mL IVPB (not administered)  oxyCODONE-acetaminophen (PERCOCET/ROXICET) 5-325 MG per tablet 1-2 tablet (2 tablets Oral Given 06/28/15 1123)  sodium chloride 0.9 % bolus 1,000 mL (1,000 mLs Intravenous Transfusing/Transfer 06/27/15 1949)  morphine 4 MG/ML injection 4 mg (4 mg Intravenous Given 06/27/15 1555)  ondansetron (ZOFRAN) injection 4 mg (4 mg Intravenous Given 06/27/15 1555)  iopamidol (ISOVUE-300) 61 % injection 100 mL (100 mLs Intravenous Contrast Given 06/27/15 1643)  cefTRIAXone (ROCEPHIN) 1 g in dextrose 5 % 50 mL IVPB (0 g Intravenous Stopped 06/27/15 1943)  metroNIDAZOLE (FLAGYL) IVPB 500 mg (0 mg Intravenous Stopped 06/27/15 1942)  morphine 4 MG/ML injection 4 mg (4 mg Intravenous Given 06/27/15 1838)  ondansetron (ZOFRAN) injection 4 mg (4 mg Intravenous Given 06/27/15 1838)  cefTRIAXone (ROCEPHIN) 1 g in dextrose 5 % 50 mL IVPB (1 g Intravenous Given 06/27/15 2334)  zolpidem (AMBIEN) tablet 5 mg (5 mg Oral Given 06/27/15 2333)  promethazine (PHENERGAN) injection 12.5 mg (12.5 mg Intravenous Given 06/27/15 2333)  HYDROmorphone (DILAUDID) injection 0.5 mg (0.5 mg Intravenous Given 06/27/15 2334)    Patient Vitals for the past 24 hrs:  BP Temp Temp src Pulse Resp SpO2 Height Weight  06/28/15 0516 (!) 92/52 mmHg 99.1 F (37.3 C) Oral 90 20 100 % - -  06/27/15 2007 (!) 154/114 mmHg 98.6 F (37 C) Oral 100 20 98 %  (1.803 m) 215 lb 6.2 oz (97.7 kg)  06/27/15 1813 (!) 101/52 mmHg - - 86 16 100 % - -  06/27/15 1405 116/69 mmHg 98.4 F (36.9 C) Oral 103 18 100 %  (1.803 m) 215 lb (97.523 kg)    5:53 PM Reevaluation with update and discussion. After initial assessment and treatment, an updated evaluation reveals She had initial relief with the medications, which have been given, but the pain has started to  come back. She was updated on the current findings, and given additional medications. Angelle Isais L   17:27 -Consult Gen. surgery - Call returned by surgery at 18:50. After he reviewed the images. Dr. Earlene Plater recommends admit the patient, treat with Rocephin and Flagyl, and he will evaluate the patient tomorrow. He states that she is not a surgical candidate, at this time because of significant cecal inflammation, which would result in a colectomy if she was operated on for appendicitis. He does not believe that she has appendicitis, rather a nonspecific cecal inflammation or infection.  6:55 PM-Consult complete with hospitalist. Patient case explained and discussed. He agrees to admit patient for further evaluation and treatment. Call ended at 18:10     Labs Review Labs Reviewed  CBC WITH DIFFERENTIAL/PLATELET - Abnormal; Notable for the following:    WBC 13.7 (*)    Hemoglobin 11.7 (*)    HCT 35.9 (*)    Neutro Abs 11.0 (*)    Monocytes Absolute 1.1 (*)    All other components within normal limits  BASIC METABOLIC PANEL - Abnormal; Notable for the following:    Glucose, Bld 106 (*)    All other components within normal limits  URINALYSIS, ROUTINE W REFLEX MICROSCOPIC (NOT AT [facility]) - Abnormal; Notable for the following:    Specific Gravity, Urine <1.005 (*)    Leukocytes, UA TRACE (*)    All other components within normal limits  URINE MICROSCOPIC-ADD ON - Abnormal; Notable for the following:    Squamous Epithelial / LPF 6-30 (*)    Bacteria, UA FEW (*)    All other components within normal limits  CBC - Abnormal; Notable for the following:    WBC 12.7 (*)    RBC 3.79 (*)    Hemoglobin 11.1 (*)    HCT 34.6 (*)    All other components within normal limits  COMPREHENSIVE METABOLIC PANEL - Abnormal; Notable for the following:    Potassium 3.2 (*)    Glucose, Bld 107 (*)    Calcium 8.2 (*)    AST 9 (*)    ALT 10 (*)    All other components within normal limits  PREGNANCY, URINE     Imaging Review Ct Abdomen Pelvis W Contrast  06/27/2015  CLINICAL DATA:  Right lower quadrant pain. EXAM: CT ABDOMEN AND PELVIS WITH CONTRAST TECHNIQUE: Multidetector CT imaging of the abdomen and pelvis was performed using the standard protocol following bolus administration of intravenous contrast. CONTRAST:  ISOVUE-300 IOPAMIDOL (ISOVUE-300) INJECTION  61% COMPARISON:  None. FINDINGS: Lung bases are normal. No free air. There is free fluid in the pelvis. There is intense stranding and wall thickening associated with the distal tip of the cecum which is circumferential. A discrete appendix is not visualized. There is fecal loading throughout much of the colon. The stomach and small bowel are normal. Specifically, the terminal ileum is normal in appearance. The liver, gallbladder, portal vein, spleen, adrenal glands, pancreas, and kidneys are normal. No aneurysm or adenopathy. The pelvis demonstrates no adenopathy. The uterus is normal as is the left ovary. There is a 3 cm cyst in the right ovary and free-fluid posteriorly in the pelvis. The bladder is normal. Visualized bones are normal. IMPRESSION: 1. Intense wall thickening associated with the cecum with adjacent stranding. The appendix is not seen as a discrete structure. The findings are favored to arise from the cecum representing an inflammatory or infectious cecal process. Neoplasm is less likely given patient age. Appendicitis is considered less likely given the lack of a discretely seen appendix. Findings called to Dr. Effie Shy. Electronically Signed   By: Gerome Sam III M.D   On: 06/27/2015 17:23   I have personally reviewed and evaluated these images and lab results as part of my medical decision-making.   EKG Interpretation None      MDM   Final diagnoses:  Colitis    Colitis vs. Occult appendicitis. No obstructive sx. Or evidence for perforation. Surgical consultation done by phone. She is not a surgical candidate at this  time. Doubt sepsis. Admission required.  Nursing Notes Reviewed/ Care Coordinated Applicable Imaging Reviewed Interpretation of Laboratory Data incorporated into ED treatment  Plan: Admit to Hospitalist with Surgical consultation.   Mancel Bale, MD 06/29/15 [phone]

## 2015-06-27 NOTE — ED Notes (Signed)
Patient ambulatory to restroom upon return from CT

## 2015-06-27 NOTE — ED Notes (Signed)
Complain of right lower abdominal pain that started yesterday. Also nausea. Pt was given morphine 4 mg IV prior to arrival. States her pain was 20 prior. It is 8/10 now
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
## 2015-06-27 NOTE — ED Notes (Signed)
Pt wet the bed while using bedpan, when asked to sit up so that I could change the sheets she said "I'm in too much pain to move."

## 2015-06-27 NOTE — ED Notes (Signed)
Patients partner assisting patient on bedpan

## 2015-06-28 DIAGNOSIS — K529 Noninfective gastroenteritis and colitis, unspecified: Secondary | ICD-10-CM | POA: Diagnosis not present

## 2015-06-28 DIAGNOSIS — E876 Hypokalemia: Secondary | ICD-10-CM | POA: Diagnosis not present

## 2015-06-28 LAB — COMPREHENSIVE METABOLIC PANEL
ALBUMIN: 3.5 g/dL (ref 3.5–5.0)
ALK PHOS: 47 U/L (ref 38–126)
ALT: 10 U/L — AB (ref 14–54)
AST: 9 U/L — AB (ref 15–41)
Anion gap: 7 (ref 5–15)
BUN: 6 mg/dL (ref 6–20)
CHLORIDE: 105 mmol/L (ref 101–111)
CO2: 26 mmol/L (ref 22–32)
CREATININE: 0.72 mg/dL (ref 0.44–1.00)
Calcium: 8.2 mg/dL — ABNORMAL LOW (ref 8.9–10.3)
GFR calc Af Amer: >60 mL/min (ref >60)
Glucose, Bld: 107 mg/dL — ABNORMAL HIGH (ref 65–99)
Potassium: 3.2 mmol/L — ABNORMAL LOW (ref 3.5–5.1)
Sodium: 138 mmol/L (ref 135–145)
Total Bilirubin: 0.5 mg/dL (ref 0.3–1.2)
Total Protein: 6.5 g/dL (ref 6.5–8.1)

## 2015-06-28 LAB — CBC
HEMATOCRIT: 34.6 % — AB (ref 36.0–46.0)
HEMOGLOBIN: 11.1 g/dL — AB (ref 12.0–15.0)
MCH: 29.3 pg (ref 26.0–34.0)
MCHC: 32.1 g/dL (ref 30.0–36.0)
MCV: 91.3 fL (ref 78.0–100.0)
Platelets: 268 10*3/uL (ref 150–400)
RBC: 3.79 MIL/uL — AB (ref 3.87–5.11)
RDW: 13.9 % (ref 11.5–15.5)
WBC: 12.7 10*3/uL — AB (ref 4.0–10.5)

## 2015-06-28 MED ORDER — OXYCODONE-ACETAMINOPHEN 5-325 MG PO TABS
1.0000 | ORAL_TABLET | Freq: Four times a day (QID) | ORAL | Status: DC | PRN
  Administered 2015-06-28 – 2015-07-02 (×14): 2 via ORAL
  Filled 2015-06-28 (×15): qty 2

## 2015-06-28 MED ORDER — ZOLPIDEM TARTRATE 5 MG PO TABS
5.0000 mg | ORAL_TABLET | Freq: Every evening | ORAL | Status: DC | PRN
  Administered 2015-06-28 – 2015-07-02 (×4): 5 mg via ORAL
  Filled 2015-06-28 (×4): qty 1

## 2015-06-28 MED ORDER — POTASSIUM CHLORIDE 20 MEQ PO PACK
40.0000 meq | PACK | Freq: Once | ORAL | Status: AC
  Administered 2015-06-28: 40 meq via ORAL
  Filled 2015-06-28: qty 2
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
## 2015-06-29 DIAGNOSIS — E876 Hypokalemia: Secondary | ICD-10-CM | POA: Diagnosis not present

## 2015-06-29 DIAGNOSIS — K529 Noninfective gastroenteritis and colitis, unspecified: Principal | ICD-10-CM

## 2015-06-29 LAB — CBC
HCT: 30.6 % — ABNORMAL LOW (ref 36.0–46.0)
HEMOGLOBIN: 9.6 g/dL — AB (ref 12.0–15.0)
MCH: 28.7 pg (ref 26.0–34.0)
MCHC: 31.4 g/dL (ref 30.0–36.0)
MCV: 91.6 fL (ref 78.0–100.0)
Platelets: 216 10*3/uL (ref 150–400)
RBC: 3.34 MIL/uL — ABNORMAL LOW (ref 3.87–5.11)
RDW: 13.9 % (ref 11.5–15.5)
WBC: 6.8 10*3/uL (ref 4.0–10.5)

## 2015-06-29 LAB — BASIC METABOLIC PANEL
BUN: <5 mg/dL — ABNORMAL LOW (ref 6–20)
CALCIUM: 8.1 mg/dL — AB (ref 8.9–10.3)
CO2: 27 mmol/L (ref 22–32)
Chloride: 108 mmol/L (ref 101–111)
Creatinine, Ser: 0.67 mg/dL (ref 0.44–1.00)
Glucose, Bld: 123 mg/dL — ABNORMAL HIGH (ref 65–99)
Potassium: 3.5 mmol/L (ref 3.5–5.1)
SODIUM: 136 mmol/L (ref 135–145)

## 2015-06-29 MED ORDER — KCL IN DEXTROSE-NACL 20-5-0.9 MEQ/L-%-% IV SOLN
INTRAVENOUS | Status: DC
Start: 2015-06-29 — End: 2015-07-01
  Administered 2015-06-29 – 2015-06-30 (×4): via INTRAVENOUS

## 2015-06-29 MED ORDER — MORPHINE SULFATE (PF) 2 MG/ML IV SOLN
2.0000 mg | INTRAVENOUS | Status: DC | PRN
  Administered 2015-06-29 – 2015-07-02 (×21): 2 mg via INTRAVENOUS
  Filled 2015-06-29 (×24): qty 1

## 2015-06-29 NOTE — Progress Notes (Signed)
SURGICAL PROGRESS NOTE   Hospital Day(s): 1.   Post op day(s):  Marland Kitchen.   Interval History: Patient seen and examined, no acute events or new complaints overnight. Patient reports RLQ pain seems unchanged, though she also affirms she has not required pain medications as much to achieve the same level of pain control. She also reports passage of flatus and denies nausea, fever/chills, CP, or SOB, and has been out of bed and ambulating without any difficulty.   Review of Systems:  Constitutional: denies fever, chills  HEENT: denies cough or congestion  Respiratory: denies any shortness of breath  Cardiovascular: denies chest pain or palpitations  Gastrointestinal: denies N/V, diarrhea or constipation  Musculoskeletal: denies pain, decreased motor or sensation  Neurological: denies HA or vision/hearing changes   Vital signs in last 24 hours: [min-max] current  Temp:  [97.5 F (36.4 C)-98.4 F (36.9 C)] 98.4 F (36.9 C) (06/06 0631) Pulse Rate:  [85-91] 85 (06/05 2110) Resp:  [20] 20 (06/06 0631) BP: (91-110)/(54-73) 91/54 mmHg (06/06 0631) SpO2:  [99 %-100 %] 99 % (06/06 0631)     Height: 5\' 11"  (180.3 cm) Weight: 97.7 kg (215 lb 6.2 oz) BMI (Calculated): 30.1   Intake/Output this shift:  Total I/O In: 240 [P.O.:240] Out: -    Intake/Output last 2 shifts:  @IOLAST2SHIFTS @   Physical Exam:  Constitutional: alert, cooperative and no distress  HENT: normocephalic without obvious abnormality  Eyes: PERRL, EOM's grossly intact and symmetric  Neuro: CN II - XII grossly intact and symmetric without deficit  Respiratory: breathing non-labored at rest  Cardiovascular: regular rate and sinus rhythm  Gastrointestinal: soft, much decreased mild RLQ tenderness, and non-distended  Musculoskeletal: UE and LE FROM, no edema or wounds, motor and sensation grossly intact, NT   Labs:  CBC:  Lab Results  Component Value Date   WBC 6.8 06/29/2015   RBC 3.34* 06/29/2015   BMP:  Lab Results   Component Value Date   GLUCOSE 123* 06/29/2015   CO2 27 06/29/2015   BUN <5* 06/29/2015   CREATININE 0.67 06/29/2015   CALCIUM 8.1* 06/29/2015     Imaging studies: No new pertinent imaging  Assessment/Plan:  30 y.o. female with circumferential cecal colitis, now resolved leukocytosis, and non-visualization of the appendix, complicated by pertinent comorbidities including asthma, overweight, and autism.  - IV Rocephin, Flagyl - Though etiology is unclear and may include non-visualized appendicitis among other diagnoses, cecal inflammation significantly increases the risk of staple line blowout and/or partial colectomy if surgery is required  - Will require outpatient colonoscopy within 6 - 8 weeks following non-operative management - Clear liquids okay when pain and appetite improve  - DVT prophylaxis  All of the above findings and recommendations were discussed with the patient, and all of her questions were answered to her expressed satisfaction.  Thank you for the opportunity to participate in the care for this patient.   -- Scherrie GerlachJason E. Earlene Plateravis, MD [place]: [facility] Office: [phone]

## 2015-06-29 NOTE — Progress Notes (Signed)
Pt admits to setting phone alarm, so that she will not sleep through medication due times.

## 2015-06-29 NOTE — Progress Notes (Signed)
[facility] PROGRESS NOTE  Joanna InaKristina Wimmer JWJ:[id number]:[id number] DOB: 12/07/1985    PCP:   No PCP Per Patient   HPI: Joanna Flowers is an 30 y.o. female with hx of autism, asthma, obesity admitted for cecal colitis, and was started on IV antibiotics.  Her pain persisted but has improved.  Her leukocytosis resolved.  She was seen in consultation with surgery, and Dr Earlene Plateravis recommended conservative treatment.  Her hypokalemia was better.   Rewiew of Systems:  Constitutional: Negative for malaise, fever and chills. No significant weight loss or weight gain Eyes: Negative for eye pain, redness and discharge, diplopia, visual changes, or flashes of light. ENMT: Negative for ear pain, hoarseness, nasal congestion, sinus pressure and sore throat. No headaches; tinnitus, drooling, or problem swallowing. Cardiovascular: Negative for chest pain, palpitations, diaphoresis, dyspnea and peripheral edema. ; No orthopnea, PND Respiratory: Negative for cough, hemoptysis, wheezing and stridor. No pleuritic chestpain. Gastrointestinal: Negative for nausea, vomiting, diarrhea, constipation, abdominal pain, melena, blood in stool, hematemesis, jaundice and rectal bleeding.    Genitourinary: Negative for frequency, dysuria, incontinence,flank pain and hematuria; Musculoskeletal: Negative for back pain and neck pain. Negative for swelling and trauma.;  Skin: . Negative for pruritus, rash, abrasions, bruising and skin lesion.; ulcerations Neuro: Negative for headache, lightheadedness and neck stiffness. Negative for weakness, altered level of consciousness , altered mental status, extremity weakness, burning feet, involuntary movement, seizure and syncope.  Psych: negative for anxiety, depression, insomnia, tearfulness, panic attacks, hallucinations, paranoia, suicidal or homicidal ideation    Past Medical History  Diagnosis Date  . Sciatica, left side   . Autism spectrum   . Asthma     History reviewed. No  pertinent past surgical history.  Medications:  HOME MEDS: Prior to Admission medications   Medication Sig Start Date End Date Taking? Authorizing Provider  Cholecalciferol (VITAMIN D PO) Take 1 tablet by mouth daily.   Yes Historical Provider, MD  CINNAMON PO Take 1 tablet by mouth daily.   Yes Historical Provider, MD  diazepam (VALIUM) 2 MG tablet Take 1 tablet (2 mg total) by mouth every 12 (twelve) hours as needed for anxiety. 05/23/15  Yes Hope Orlene OchM Neese, NP  fluticasone (FLONASE) 50 MCG/ACT nasal spray Place 1 spray into both nostrils daily as needed for allergies.    Yes Historical Provider, MD  HYDROcodone-acetaminophen (NORCO/VICODIN) 5-325 MG tablet Take 1 tablet by mouth every 6 (six) hours as needed for moderate pain. Reported on 06/14/2015   Yes Historical Provider, MD  loratadine (CLARITIN) 10 MG tablet Take 10 mg by mouth daily. 06/15/15  Yes Historical Provider, MD  Melatonin 3 MG CAPS Take 3 mg by mouth at bedtime.   Yes Historical Provider, MD  PROAIR HFA 108 (90 Base) MCG/ACT inhaler INHALE 2 PUFFS BY MOUTH TWO TIMES DAILY AND AS NEEDED, MAX 4 TIMES DAILY 06/15/15  Yes Historical Provider, MD  promethazine (PHENERGAN) 12.5 MG tablet Take 1 tablet (12.5 mg total) by mouth every 6 (six) hours as needed for nausea or vomiting. 05/15/15  Yes Ivery QualeHobson Bryant, PA-C  tiZANidine (ZANAFLEX) 4 MG capsule Take 1 capsule (4 mg total) by mouth 2 (two) times daily. Patient taking differently: Take 4 mg by mouth at bedtime.  05/15/15  Yes Ivery QualeHobson Bryant, PA-C  traMADol (ULTRAM) 50 MG tablet Take 50 mg by mouth every 6 (six) hours as needed. 06/15/15  Yes Historical Provider, MD  VITAMIN A PO Take 1 tablet by mouth daily.   Yes Historical Provider, MD  cephALEXin (KEFLEX) 500  MG capsule Take 1 capsule (500 mg total) by mouth 4 (four) times daily. Patient not taking: Reported on 06/14/2015 05/15/15   Ivery Quale, PA-C  cyclobenzaprine (FLEXERIL) 10 MG tablet Take 1 tablet (10 mg total) by mouth 3 (three)  times daily. Patient not taking: Reported on 06/14/2015 05/15/15   Ivery Quale, PA-C  dexamethasone (DECADRON) 4 MG tablet Take 1 tablet (4 mg total) by mouth 2 (two) times daily with a meal. Patient not taking: Reported on 06/14/2015 05/15/15   Ivery Quale, PA-C  predniSONE (DELTASONE) 10 MG tablet Take 4 tablets (40 mg total) by mouth daily. Patient not taking: Reported on 06/27/2015 06/14/15   Vanetta Mulders, MD     Allergies:  Allergies  Allergen Reactions  . Nsaids     Ulcers     Social History:   reports that she has been smoking Cigarettes.  She does not have any smokeless tobacco history on file. She reports that she does not drink alcohol or use illicit drugs.  Family History: History reviewed. No pertinent family history.   Physical Exam: Filed Vitals:   06/28/15 0516 06/28/15 1446 06/28/15 2110 06/29/15 0631  BP: 92/52 106/63 110/73 91/54  Pulse: 90 91 85   Temp: 99.1 F (37.3 C) 98.1 F (36.7 C) 97.5 F (36.4 C) 98.4 F (36.9 C)  TempSrc: Oral Oral Oral Oral  Resp: 20 20 20 20   Height:      Weight:      SpO2: 100% 100% 100% 99%   Blood pressure 91/54, pulse 85, temperature 98.4 F (36.9 C), temperature source Oral, resp. rate 20, height 5\' 11"  (1.803 m), weight 97.7 kg (215 lb 6.2 oz), last menstrual period 06/12/2015, SpO2 99 %.  GEN:  Pleasant  patient lying in the stretcher in no acute distress; cooperative with exam. PSYCH:  alert and oriented x4; does not appear anxious or depressed; affect is appropriate. HEENT: Mucous membranes pink and anicteric; PERRLA; EOM intact; no cervical lymphadenopathy nor thyromegaly or carotid bruit; no JVD; There were no stridor. Neck is very supple. Breasts:: Not examined CHEST WALL: No tenderness CHEST: Normal respiration, clear to auscultation bilaterally.  HEART: Regular rate and rhythm.  There are no murmur, rub, or gallops.   BACK: No kyphosis or scoliosis; no CVA tenderness ABDOMEN: soft but diffusely tender, no  masses, no organomegaly, normal abdominal bowel sounds; no pannus; no intertriginous candida. There is no rebound and no distention. Rectal Exam: Not done EXTREMITIES: No bone or joint deformity; age-appropriate arthropathy of the hands and knees; no edema; no ulcerations.  There is no calf tenderness. Genitalia: not examined PULSES: 2+ and symmetric SKIN: Normal hydration no rash or ulceration CNS: Cranial nerves 2-12 grossly intact no focal lateralizing neurologic deficit.  Speech is fluent; uvula elevated with phonation, facial symmetry and tongue midline. DTR are normal bilaterally, cerebella exam is intact, barbinski is negative and strengths are equaled bilaterally.  No sensory loss.   Labs on Admission:  Basic Metabolic Panel:  Recent Labs Lab 06/27/15 1428 06/28/15 0336 06/29/15 0607  NA 136 138 136  K 3.5 3.2* 3.5  CL 104 105 108  CO2 26 26 27   GLUCOSE 106* 107* 123*  BUN 8 6 <5*  CREATININE 0.65 0.72 0.67  CALCIUM 9.0 8.2* 8.1*   Liver Function Tests:  Recent Labs Lab 06/28/15 0336  AST 9*  ALT 10*  ALKPHOS 47  BILITOT 0.5  PROT 6.5  ALBUMIN 3.5   CBC:  Recent Labs Lab 06/27/15 1428  06/28/15 0336 06/29/15 0607  WBC 13.7* 12.7* 6.8  NEUTROABS 11.0*  --   --   HGB 11.7* 11.1* 9.6*  HCT 35.9* 34.6* 30.6*  MCV 90.2 91.3 91.6  PLT 250 268 216    Radiological Exams on Admission: Ct Abdomen Pelvis W Contrast  06/27/2015  CLINICAL DATA:  Right lower quadrant pain. EXAM: CT ABDOMEN AND PELVIS WITH CONTRAST TECHNIQUE: Multidetector CT imaging of the abdomen and pelvis was performed using the standard protocol following bolus administration of intravenous contrast. CONTRAST:  ISOVUE-300 IOPAMIDOL (ISOVUE-300) INJECTION 61% COMPARISON:  None. FINDINGS: Lung bases are normal. No free air. There is free fluid in the pelvis. There is intense stranding and wall thickening associated with the distal tip of the cecum which is circumferential. A discrete appendix is  not visualized. There is fecal loading throughout much of the colon. The stomach and small bowel are normal. Specifically, the terminal ileum is normal in appearance. The liver, gallbladder, portal vein, spleen, adrenal glands, pancreas, and kidneys are normal. No aneurysm or adenopathy. The pelvis demonstrates no adenopathy. The uterus is normal as is the left ovary. There is a 3 cm cyst in the right ovary and free-fluid posteriorly in the pelvis. The bladder is normal. Visualized bones are normal. IMPRESSION: 1. Intense wall thickening associated with the cecum with adjacent stranding. The appendix is not seen as a discrete structure. The findings are favored to arise from the cecum representing an inflammatory or infectious cecal process. Neoplasm is less likely given patient age. Appendicitis is considered less likely given the lack of a discretely seen appendix. Findings called to Dr. Effie Shy. Electronically Signed   By: Gerome Sam III M.D   On: 06/27/2015 17:23    EKG: Independently reviewed.    Assessment/Plan Present on Admission:  . Colitis  PLAN:  Cecal colitis:   Appendix was not well visualized.  Surgery consulted.   Will continue with clear liquid.  Will increase pain meds, continue with IVF and add maintenance K supplements.    Asthma:  Stable.   Other plans as per orders. Code Status: FULL Unk Lightning, MD.  FACP [facility] Pager [phone] 7pm to 7am.  06/29/2015, 10:53 AM

## 2015-06-30 DIAGNOSIS — E876 Hypokalemia: Secondary | ICD-10-CM | POA: Diagnosis not present

## 2015-06-30 DIAGNOSIS — K529 Noninfective gastroenteritis and colitis, unspecified: Secondary | ICD-10-CM | POA: Diagnosis not present

## 2015-06-30 NOTE — Progress Notes (Signed)
[facility] PROGRESS NOTE  Joanna Flowers ZOX:[id number] DOB: 1985/08/09    PCP:   No PCP Per Patient   HPI:  Joanna Flowers is an 30 y.o. female with hx of autism, asthma, obesity admitted for cecal colitis, and was started on IV antibiotics. Her pain persisted but has improved. Her leukocytosis resolved. She was seen in consultation with surgery, and Dr Earlene Plater recommended conservative treatment. Her hypokalemia was better. She is still having RLQ pain, and tolerating only clear liquid.     Rewiew of Systems:  Constitutional: Negative for malaise, fever and chills. No significant weight loss or weight gain Eyes: Negative for eye pain, redness and discharge, diplopia, visual changes, or flashes of light. ENMT: Negative for ear pain, hoarseness, nasal congestion, sinus pressure and sore throat. No headaches; tinnitus, drooling, or problem swallowing. Cardiovascular: Negative for chest pain, palpitations, diaphoresis, dyspnea and peripheral edema. ; No orthopnea, PND Respiratory: Negative for cough, hemoptysis, wheezing and stridor. No pleuritic chestpain. Gastrointestinal: Negative for nausea, vomiting, diarrhea, constipation,  melena, blood in stool, hematemesis, jaundice and rectal bleeding.    Genitourinary: Negative for frequency, dysuria, incontinence,flank pain and hematuria; Musculoskeletal: Negative for back pain and neck pain. Negative for swelling and trauma.;  Skin: . Negative for pruritus, rash, abrasions, bruising and skin lesion.; ulcerations Neuro: Negative for headache, lightheadedness and neck stiffness. Negative for weakness, altered level of consciousness , altered mental status, extremity weakness, burning feet, involuntary movement, seizure and syncope.  Psych: negative for anxiety, depression, insomnia, tearfulness, panic attacks, hallucinations, paranoia, suicidal or homicidal ideation   Past Medical History  Diagnosis Date  . Sciatica, left side   .  Autism spectrum   . Asthma     History reviewed. No pertinent past surgical history.  Medications:  HOME MEDS: Prior to Admission medications   Medication Sig Start Date End Date Taking? Authorizing Provider  Cholecalciferol (VITAMIN D PO) Take 1 tablet by mouth daily.   Yes Historical Provider, MD  CINNAMON PO Take 1 tablet by mouth daily.   Yes Historical Provider, MD  diazepam (VALIUM) 2 MG tablet Take 1 tablet (2 mg total) by mouth every 12 (twelve) hours as needed for anxiety. 05/23/15  Yes Hope Orlene Och, NP  fluticasone (FLONASE) 50 MCG/ACT nasal spray Place 1 spray into both nostrils daily as needed for allergies.    Yes Historical Provider, MD  HYDROcodone-acetaminophen (NORCO/VICODIN) 5-325 MG tablet Take 1 tablet by mouth every 6 (six) hours as needed for moderate pain. Reported on 06/14/2015   Yes Historical Provider, MD  loratadine (CLARITIN) 10 MG tablet Take 10 mg by mouth daily. 06/15/15  Yes Historical Provider, MD  Melatonin 3 MG CAPS Take 3 mg by mouth at bedtime.   Yes Historical Provider, MD  PROAIR HFA 108 (90 Base) MCG/ACT inhaler INHALE 2 PUFFS BY MOUTH TWO TIMES DAILY AND AS NEEDED, MAX 4 TIMES DAILY 06/15/15  Yes Historical Provider, MD  promethazine (PHENERGAN) 12.5 MG tablet Take 1 tablet (12.5 mg total) by mouth every 6 (six) hours as needed for nausea or vomiting. 05/15/15  Yes Ivery Quale, PA-C  tiZANidine (ZANAFLEX) 4 MG capsule Take 1 capsule (4 mg total) by mouth 2 (two) times daily. Patient taking differently: Take 4 mg by mouth at bedtime.  05/15/15  Yes Ivery Quale, PA-C  traMADol (ULTRAM) 50 MG tablet Take 50 mg by mouth every 6 (six) hours as needed. 06/15/15  Yes Historical Provider, MD  VITAMIN A PO Take 1 tablet by mouth daily.  Yes Historical Provider, MD  cephALEXin (KEFLEX) 500 MG capsule Take 1 capsule (500 mg total) by mouth 4 (four) times daily. Patient not taking: Reported on 06/14/2015 05/15/15   Ivery QualeHobson Bryant, PA-C  cyclobenzaprine (FLEXERIL) 10  MG tablet Take 1 tablet (10 mg total) by mouth 3 (three) times daily. Patient not taking: Reported on 06/14/2015 05/15/15   Ivery QualeHobson Bryant, PA-C  dexamethasone (DECADRON) 4 MG tablet Take 1 tablet (4 mg total) by mouth 2 (two) times daily with a meal. Patient not taking: Reported on 06/14/2015 05/15/15   Ivery QualeHobson Bryant, PA-C  predniSONE (DELTASONE) 10 MG tablet Take 4 tablets (40 mg total) by mouth daily. Patient not taking: Reported on 06/27/2015 06/14/15   Vanetta MuldersScott Zackowski, MD     Allergies:  Allergies  Allergen Reactions  . Nsaids     Ulcers     Social History:   reports that she has been smoking Cigarettes.  She does not have any smokeless tobacco history on file. She reports that she does not drink alcohol or use illicit drugs.  Family History: History reviewed. No pertinent family history.   Physical Exam: Filed Vitals:   06/29/15 0631 06/29/15 1528 06/29/15 2218 06/30/15 0501  BP: 91/54 108/69 111/73 101/60  Pulse:  78 77 71  Temp: 98.4 F (36.9 C) 98.2 F (36.8 C) 99.4 F (37.4 C) 98.1 F (36.7 C)  TempSrc: Oral  Oral Oral  Resp: 20 18 20 19   Height:      Weight:      SpO2: 99% 100% 100% 100%   Blood pressure 101/60, pulse 71, temperature 98.1 F (36.7 C), temperature source Oral, resp. rate 19, height 5\' 11"  (1.803 m), weight 97.7 kg (215 lb 6.2 oz), last menstrual period 06/12/2015, SpO2 100 %.  GEN:  Pleasant  patient lying in the stretcher in no acute distress; cooperative with exam. PSYCH:  alert and oriented x4; does not appear anxious or depressed; affect is appropriate. HEENT: Mucous membranes pink and anicteric; PERRLA; EOM intact; no cervical lymphadenopathy nor thyromegaly or carotid bruit; no JVD; There were no stridor. Neck is very supple. Breasts:: Not examined CHEST WALL: No tenderness CHEST: Normal respiration, clear to auscultation bilaterally.  HEART: Regular rate and rhythm.  There are no murmur, rub, or gallops.   BACK: No kyphosis or scoliosis; no  CVA tenderness ABDOMEN: soft and tender RLQ, but no masses, no organomegaly, normal abdominal bowel sounds; no pannus; no intertriginous candida. There is no rebound and no distention. Rectal Exam: Not done EXTREMITIES: No bone or joint deformity; age-appropriate arthropathy of the hands and knees; no edema; no ulcerations.  There is no calf tenderness. Genitalia: not examined PULSES: 2+ and symmetric SKIN: Normal hydration no rash or ulceration CNS: Cranial nerves 2-12 grossly intact no focal lateralizing neurologic deficit.  Speech is fluent; uvula elevated with phonation, facial symmetry and tongue midline. DTR are normal bilaterally, cerebella exam is intact, barbinski is negative and strengths are equaled bilaterally.  No sensory loss.   Labs on Admission:  Basic Metabolic Panel:  Recent Labs Lab 06/27/15 1428 06/28/15 0336 06/29/15 0607  NA 136 138 136  K 3.5 3.2* 3.5  CL 104 105 108  CO2 26 26 27   GLUCOSE 106* 107* 123*  BUN 8 6 <5*  CREATININE 0.65 0.72 0.67  CALCIUM 9.0 8.2* 8.1*   Liver Function Tests:  Recent Labs Lab 06/28/15 0336  AST 9*  ALT 10*  ALKPHOS 47  BILITOT 0.5  PROT 6.5  ALBUMIN 3.5  CBC:  Recent Labs Lab 06/27/15 1428 06/28/15 0336 06/29/15 0607  WBC 13.7* 12.7* 6.8  NEUTROABS 11.0*  --   --   HGB 11.7* 11.1* 9.6*  HCT 35.9* 34.6* 30.6*  MCV 90.2 91.3 91.6  PLT 250 268 216    Assessment/Plan Present on Admission:  . Colitis  PLAN:  Cecal colitis: Appendix was not well visualized. Surgery consulted. Will continue with clear liquid. Continue with PRN pain meds.  She will need colonsocopy in 6-8 weeks in follow up per Dr Earlene Plater.      Other plans as per orders. Code Status: FULL Unk Lightning, MD.  FACP [facility] Pager [phone] 7pm to 7am.  06/30/2015, 1:05 PM

## 2015-06-30 NOTE — Progress Notes (Signed)
Patient c/o constant, stabbing type abdominal pain, rates 6-7/10 on 0-10 pain scale. Receiving morphine 2 mg IV every 2 hours PRN pain and percocet 2 tabs po every 6 hours PRN pain. Discussed with patient this afternoon that we do not give percocet and morphine together, they would need to be about an hour apart. Verbalized understanding. Reports that pain medication "makes it bearable but it doesn't go away". Pt has required pain medication each time available on PRN schedule. Discussed pain management with Dr. Conley RollsLe this afternoon to make aware. Earnstine RegalAshley Ceira Hoeschen, RN

## 2015-06-30 NOTE — Progress Notes (Addendum)
SURGICAL PROGRESS NOTE   Hospital Day(s): 2.   Post op day(s):  Marland Kitchen.   Interval History: Patient seen and examined, no acute events or new complaints overnight. Patient reports pain well-controlled, +flatus and BM, tolerating liquids, +ambulating, denies nausea, fever/chills, CP, or SOB.  Review of Systems:  Constitutional: denies fever, chills  HEENT: denies cough or congestion  Respiratory: denies any shortness of breath  Cardiovascular: denies chest pain or palpitations  Gastrointestinal: abdominal pain, nausea, and bowel function as per HPI  Musculoskeletal: denies pain, decreased motor or sensation  Neurological: denies HA or vision/hearing changes   Vital signs in last 24 hours: [min-max] current  Temp:  [98.1 F (36.7 C)-99.4 F (37.4 C)] 98.1 F (36.7 C) (06/07 0501) Pulse Rate:  [71-78] 71 (06/07 0501) Resp:  [18-20] 19 (06/07 0501) BP: (101-111)/(60-73) 101/60 mmHg (06/07 0501) SpO2:  [100 %] 100 % (06/07 0501)     Height: 5\' 11"  (180.3 cm) Weight: 97.7 kg (215 lb 6.2 oz) BMI (Calculated): 30.1   Intake/Output this shift:      Intake/Output last 2 shifts:  @IOLAST2SHIFTS @   Physical Exam:  Constitutional: alert, cooperative and no distress  HENT: normocephalic without obvious abnormality  Eyes: PERRL, EOM's grossly intact and symmetric  Neuro: CN II - XII grossly intact and symmetric without deficit  Respiratory: breathing non-labored at rest  Cardiovascular: regular rate and sinus rhythm  Gastrointestinal: soft, moderate RLQ tenderness though hard to distinguish true pain from anticipation/anxiety, non-distended Musculoskeletal: UE and LE FROM, motor and sensation grossly intact, NT    Labs:  CBC:  Lab Results  Component Value Date   WBC 6.8 06/29/2015   RBC 3.34* 06/29/2015   BMP:  Lab Results  Component Value Date   GLUCOSE 123* 06/29/2015   CO2 27 06/29/2015   BUN <5* 06/29/2015   CREATININE 0.67 06/29/2015   CALCIUM 8.1* 06/29/2015      Imaging studies: No new pertinent imaging studies    Assessment/Plan:  30 y.o. female with circumferential cecal colitis, now resolved leukocytosis, and non-visualization of the appendix, complicated by pertinent comorbidities including asthma, overweight, anxiety, and autism.  - IV Rocephin, Flagyl --> 10 days PO antibiotics when discharged - Though etiology is unclear and may include non-visualized appendicitis among other diagnoses, cecal inflammation significantly increases the risk of staple line blowout and/or partial colectomy if surgery is required - Will require outpatient colonoscopy within 6 - 8 weeks following non-operative management - Advance diet as tolerated  - Ambulation encouraged  - DVT prophylaxis  All of the above findings and recommendations were discussed with the patient, and all of her questions were answered to her expressed satisfaction.  Thank you for the opportunity to participate in the care for this patient.   -- Scherrie GerlachJason E. Earlene Plateravis, MD [place]: [facility] Office: [phone]

## 2015-07-01 DIAGNOSIS — K529 Noninfective gastroenteritis and colitis, unspecified: Secondary | ICD-10-CM | POA: Diagnosis not present

## 2015-07-01 DIAGNOSIS — E876 Hypokalemia: Secondary | ICD-10-CM | POA: Diagnosis not present

## 2015-07-01 MED ORDER — CIPROFLOXACIN HCL 500 MG PO TABS: 500.0000 mg | ORAL_TABLET | Freq: Two times a day (BID) | ORAL | Status: AC

## 2015-07-01 MED ORDER — CIPROFLOXACIN HCL 250 MG PO TABS
500.0000 mg | ORAL_TABLET | Freq: Two times a day (BID) | ORAL | Status: DC
  Administered 2015-07-01 – 2015-07-02 (×2): 500 mg via ORAL
  Filled 2015-07-01 (×3): qty 2

## 2015-07-01 MED ORDER — METRONIDAZOLE 500 MG PO TABS
500.0000 mg | ORAL_TABLET | Freq: Three times a day (TID) | ORAL | Status: DC
  Administered 2015-07-01 – 2015-07-02 (×3): 500 mg via ORAL
  Filled 2015-07-01 (×5): qty 1

## 2015-07-01 MED ORDER — METRONIDAZOLE 500 MG PO TABS: 500.0000 mg | ORAL_TABLET | Freq: Three times a day (TID) | ORAL | Status: AC

## 2015-07-01 MED ORDER — CIPROFLOXACIN HCL 500 MG PO TABS: 500.0000 mg | ORAL_TABLET | Freq: Two times a day (BID) | ORAL | Status: DC

## 2015-07-01 MED ORDER — METRONIDAZOLE 500 MG PO TABS: 500.0000 mg | ORAL_TABLET | Freq: Three times a day (TID) | ORAL | Status: DC

## 2015-07-01 NOTE — Progress Notes (Signed)
Late entry for 1025 this morning. Patient requested morphine for abdominal pain. BP 101/43. Notified patient morphine not usually given if BP low due to risk of lower BP. Notified Dr. Conley RollsLe. Stated to hold off on pain medication at that time due to low BP. Discussed with patient. Verbalized understanding. Earnstine RegalAshley Doyle Tegethoff, RN

## 2015-07-01 NOTE — Progress Notes (Signed)
SURGICAL PROGRESS NOTE   Hospital Day(s): 3.    Post op day(s):  Marland Kitchen.   Interval History: Patient seen and examined, no acute events or new complaints overnight. For the first time since her admission, patient reports without prompting that she feels better than when she was admitted, though adds she's still scared about not feeling well and having to have been in the hospital, denies nausea, fever/chills, CP, or SOB.  Review of Systems:  Constitutional: denies fever, chills  HEENT: denies cough or congestion  Respiratory: denies any shortness of breath  Cardiovascular: denies chest pain or palpitations  Gastrointestinal: RLQ pain improved, denies N/V or diarrhea  Musculoskeletal: denies pain, decreased motor or sensation  Neurological: denies HA or vision/hearing changes   Vital signs in last 24 hours: [min-max] current  Temp:  [97.5 F (36.4 C)-98.4 F (36.9 C)] 98.2 F (36.8 C) (06/08 0807) Pulse Rate:  [60-69] 66 (06/08 1023) Resp:  [20] 20 (06/08 0807) BP: (91-132)/(43-102) 101/43 mmHg (06/08 1023) SpO2:  [98 %-100 %] 98 % (06/08 0807)     Height: 5\' 11"  (180.3 cm) Weight: 97.7 kg (215 lb 6.2 oz) BMI (Calculated): 30.1   Intake/Output this shift:      Intake/Output last 2 shifts:  @IOLAST2SHIFTS @   Physical Exam:  Constitutional: alert, cooperative and no distress  HENT: normocephalic without obvious abnormality  Eyes: PERRL, EOM's grossly intact and symmetric  Neuro: CN II - XII grossly intact and symmetric without deficit  Respiratory: breathing non-labored at rest  Cardiovascular: regular rate and sinus rhythm  Gastrointestinal: soft,minimal RLQ tendernessa, non-distended  Musculoskeletal: UE and LE FROM, motor and sensation grossly intact, NT   Labs:  CBC:  Lab Results  Component Value Date   WBC 6.8 06/29/2015   RBC 3.34* 06/29/2015   BMP:  Lab Results  Component Value Date   GLUCOSE 123* 06/29/2015   CO2 27 06/29/2015   BUN <5* 06/29/2015   CREATININE 0.67 06/29/2015   CALCIUM 8.1* 06/29/2015     Imaging studies: No new pertinent imaging studies    Assessment/Plan:  30 y.o. female with circumferential cecal colitis, now resolved leukocytosis, and non-visualization of the appendix, complicated by pertinent comorbidities including asthma, overweight, anxiety, and autism.  - IV Rocephin, Flagyl --> 10 days PO antibiotics when discharged - Though etiology is unclear and may include non-visualized appendicitis among other inflammatory, infectious, or neoplastic diagnoses, cecal inflammation significantly increases risk of leak and/or colectomy if surgery is required - Will require outpatient colonoscopy within 6 - 8 weeks following non-operative management - Continue diet as tolerated, ambulation encouraged  - DVT prophylaxis  All of the above findings and recommendations were discussed with the patient, and all of her questions were answered to her expressed satisfaction.  Thank you for the opportunity to participate in the care for this patient.   -- Scherrie GerlachJason E. Earlene Plateravis, MD [place]: [facility] Office: [phone]

## 2015-07-01 NOTE — Progress Notes (Signed)
Patient stated she asked for privacy restrictions this evening. Pt noted to have privacy restrictions listed on chart. Discussed privacy restrictions means she will not show up in the registry and no information is given via phone or in person. Discussed that any visitors who have already been to visit her would however know which room she is in. Offered to have no visitors sign placed on door. Pt stated no need. Verbalized understanding of privacy restrictions. Earnstine RegalAshley Ayala Ribble, RN
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
## 2015-07-01 NOTE — Progress Notes (Signed)
Late entry. Discussed discharge plan with pt this evening after seen by VP of nursing to address her concerns. Pt expressed concerns regarding continued abdominal pain and some nausea this evening. Pt stated she felt "rushed to leave" and requested to speak with social worker prior to discharge. [place] States was brought to hospital via EMS. Text-paged Dr. Conley RollsLe to notify of patient concerns about discharge. Pt requests to have another physician. Notified Nursing Supervisor of patient concerns. Night shift RN aware as well. Nursing supervisor notified Dr. Adrian BlackwaterStinson, evening hospitalist coverage. Order received to hold discharge for tonight and continue current orders. Nursing supervisor to see patient and social work to see her tomorrow. Earnstine RegalAshley Shaolin Armas, RN

## 2015-07-01 NOTE — Progress Notes (Signed)
Nurse paged Dr. Adrian BlackwaterStinson. Patient has Flagyl ordered PO and IVPB. Per Dr. Adrian BlackwaterStinson discontinue Flagyl IVPB. Patient will take PO Flagyl. Nurse discontinued order for Flagyl IVPB.

## 2015-07-01 NOTE — Progress Notes (Signed)
Advance diet as tolerated per orders. Full liquids ordered. Discussed with patient. Stated will notify nursing when she is ready to attempt eating. BP 110/69. Requested pain medication. Discussed transition to percocet po as ordered PRN for pain since MD anticipates possible discharge. Verbalized understanding. Notified MD. Earnstine RegalAshley Rajeev Escue, RN

## 2015-07-01 NOTE — Progress Notes (Signed)
Late entry. Discharge orders placed this evening. Discussed with CM since for pt to be set-up with PCP for follow-up. CM provided list of PCP offices currently accepting patients. S[facility]ed [facility] in [place]. Discussed with patient. Reviewed discharge instructions with patient. Discussed follow-up with PCP and Dr. Earlene Plateravis, general surgeon. Pt stated she is in the process of establishing P[place] care [place] to where she is staying. Encouraged her to follow-up with [facility] or other office listed on sheet provided if unable to get appointment in [facility] [place] [facility] [place]. Pt also expressed concern with f/u at Dr. Earlene Plateravis office d/t lack of transportation. Dr. Earlene Plateravis aware. Pt may call office for appointment or be referred to somewhere closer by. Patient expressed concerns regarding discharge, requested to speak with Marlyn CorporalMona Easter, VP. Mona notified of patient request. Pt seen by [facility]Mona to address concerns. Notified Dr. Conley RollsLe patient had concerns regarding discharge home. MD stated patient stable for discharge. Earnstine RegalAshley Bruchy Mikel, RN
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
## 2015-07-29 ENCOUNTER — Emergency Department (HOSPITAL_COMMUNITY): Payer: Medicaid - Out of State

## 2015-07-29 ENCOUNTER — Emergency Department (HOSPITAL_COMMUNITY)
Admission: EM | Admit: 2015-07-29 | Discharge: 2015-07-29 | Disposition: A | Discharge status: Home / Self Care | Payer: Medicaid - Out of State | Attending: Emergency Medicine | Admitting: Emergency Medicine

## 2015-07-29 ENCOUNTER — Encounter (HOSPITAL_COMMUNITY): Payer: Self-pay

## 2015-07-29 DIAGNOSIS — J45909 Unspecified asthma, uncomplicated: Secondary | ICD-10-CM | POA: Diagnosis not present

## 2015-07-29 DIAGNOSIS — S8002XA Contusion of left knee, initial encounter: Secondary | ICD-10-CM | POA: Insufficient documentation

## 2015-07-29 DIAGNOSIS — Y939 Activity, unspecified: Secondary | ICD-10-CM | POA: Diagnosis not present

## 2015-07-29 DIAGNOSIS — Y999 Unspecified external cause status: Secondary | ICD-10-CM | POA: Diagnosis not present

## 2015-07-29 DIAGNOSIS — S301XXA Contusion of abdominal wall, initial encounter: Secondary | ICD-10-CM | POA: Insufficient documentation

## 2015-07-29 DIAGNOSIS — S20211A Contusion of right front wall of thorax, initial encounter: Secondary | ICD-10-CM | POA: Insufficient documentation

## 2015-07-29 DIAGNOSIS — Y92828 Other wilderness area as the place of occurrence of the external cause: Secondary | ICD-10-CM | POA: Diagnosis not present

## 2015-07-29 DIAGNOSIS — S161XXA Strain of muscle, fascia and tendon at neck level, initial encounter: Secondary | ICD-10-CM | POA: Insufficient documentation

## 2015-07-29 DIAGNOSIS — M25561 Pain in right knee: Secondary | ICD-10-CM | POA: Diagnosis present

## 2015-07-29 DIAGNOSIS — S8001XA Contusion of right knee, initial encounter: Secondary | ICD-10-CM | POA: Diagnosis not present

## 2015-07-29 DIAGNOSIS — F17210 Nicotine dependence, cigarettes, uncomplicated: Secondary | ICD-10-CM | POA: Insufficient documentation

## 2015-07-29 LAB — I-STAT CHEM 8, ED
BUN: 13 mg/dL (ref 6–20)
CHLORIDE: 102 mmol/L (ref 101–111)
Calcium, Ion: 1.21 mmol/L (ref 1.13–1.30)
Creatinine, Ser: 0.8 mg/dL (ref 0.44–1.00)
Glucose, Bld: 103 mg/dL — ABNORMAL HIGH (ref 65–99)
HEMATOCRIT: 37 % (ref 36.0–46.0)
Hemoglobin: 12.6 g/dL (ref 12.0–15.0)
POTASSIUM: 3.5 mmol/L (ref 3.5–5.1)
SODIUM: 139 mmol/L (ref 135–145)
TCO2: 27 mmol/L (ref 0–100)

## 2015-07-29 LAB — I-STAT BETA HCG BLOOD, ED (MC, WL, AP ONLY): I-stat hCG, quantitative: <5 m[IU]/mL (ref <5)

## 2015-07-29 MED ORDER — TRAMADOL HCL 50 MG PO TABS
50.0000 mg | ORAL_TABLET | Freq: Once | ORAL | Status: AC
  Administered 2015-07-29: 50 mg via ORAL
  Filled 2015-07-29: qty 1

## 2015-07-29 MED ORDER — TRAMADOL HCL 50 MG PO TABS: 50.0000 mg | ORAL_TABLET | Freq: Four times a day (QID) | ORAL | Status: AC | PRN

## 2015-07-29 MED ORDER — IBUPROFEN 800 MG PO TABS
800.0000 mg | ORAL_TABLET | Freq: Once | ORAL | Status: DC
  Filled 2015-07-29: qty 1

## 2015-07-29 MED ORDER — IOPAMIDOL (ISOVUE-300) INJECTION 61%
100.0000 mL | Freq: Once | INTRAVENOUS | Status: AC | PRN
  Administered 2015-07-29: 100 mL via INTRAVENOUS

## 2015-07-29 MED ORDER — ONDANSETRON 4 MG PO TBDP: ORAL_TABLET | ORAL | Status: AC

## 2015-07-29 MED ORDER — ONDANSETRON 4 MG PO TBDP
4.0000 mg | ORAL_TABLET | Freq: Once | ORAL | Status: AC
  Administered 2015-07-29: 4 mg via ORAL
  Filled 2015-07-29: qty 1

## 2015-07-29 NOTE — ED Notes (Signed)
[place]

## 2015-07-29 NOTE — ED Notes (Signed)
MD at bedside. 
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
## 2015-07-29 NOTE — Discharge Instructions (Signed)
Follow up next week with your md if needed

## 2016-04-24 ENCOUNTER — Emergency Department (HOSPITAL_COMMUNITY)
Admission: EM | Admit: 2016-04-24 | Discharge: 2016-04-25 | Disposition: A | Discharge status: Home / Self Care | Payer: Self-pay | Attending: Emergency Medicine | Admitting: Emergency Medicine

## 2016-04-24 ENCOUNTER — Encounter (HOSPITAL_COMMUNITY): Payer: Self-pay | Admitting: Emergency Medicine

## 2016-04-24 DIAGNOSIS — S0990XA Unspecified injury of head, initial encounter: Secondary | ICD-10-CM | POA: Insufficient documentation

## 2016-04-24 DIAGNOSIS — Y999 Unspecified external cause status: Secondary | ICD-10-CM | POA: Insufficient documentation

## 2016-04-24 DIAGNOSIS — S300XXA Contusion of lower back and pelvis, initial encounter: Secondary | ICD-10-CM | POA: Insufficient documentation

## 2016-04-24 DIAGNOSIS — F17210 Nicotine dependence, cigarettes, uncomplicated: Secondary | ICD-10-CM | POA: Insufficient documentation

## 2016-04-24 DIAGNOSIS — Y929 Unspecified place or not applicable: Secondary | ICD-10-CM | POA: Insufficient documentation

## 2016-04-24 DIAGNOSIS — R1031 Right lower quadrant pain: Secondary | ICD-10-CM | POA: Insufficient documentation

## 2016-04-24 DIAGNOSIS — T07XXXA Unspecified multiple injuries, initial encounter: Secondary | ICD-10-CM

## 2016-04-24 DIAGNOSIS — J45909 Unspecified asthma, uncomplicated: Secondary | ICD-10-CM | POA: Insufficient documentation

## 2016-04-24 DIAGNOSIS — S0003XA Contusion of scalp, initial encounter: Secondary | ICD-10-CM | POA: Insufficient documentation

## 2016-04-24 DIAGNOSIS — Y9389 Activity, other specified: Secondary | ICD-10-CM | POA: Insufficient documentation

## 2016-04-24 DIAGNOSIS — S61211A Laceration without foreign body of left index finger without damage to nail, initial encounter: Secondary | ICD-10-CM | POA: Insufficient documentation

## 2016-04-24 DIAGNOSIS — W503XXA Accidental bite by another person, initial encounter: Secondary | ICD-10-CM

## 2016-04-24 LAB — COMPREHENSIVE METABOLIC PANEL
ALT: 16 U/L (ref 14–54)
AST: 17 U/L (ref 15–41)
Albumin: 4.5 g/dL (ref 3.5–5.0)
Alkaline Phosphatase: 59 U/L (ref 38–126)
Anion gap: 7 (ref 5–15)
BUN: 11 mg/dL (ref 6–20)
CHLORIDE: 108 mmol/L (ref 101–111)
CO2: 26 mmol/L (ref 22–32)
CREATININE: 0.81 mg/dL (ref 0.44–1.00)
Calcium: 9.1 mg/dL (ref 8.9–10.3)
Glucose, Bld: 71 mg/dL (ref 65–99)
POTASSIUM: 3.3 mmol/L — AB (ref 3.5–5.1)
SODIUM: 141 mmol/L (ref 135–145)
Total Bilirubin: 0.6 mg/dL (ref 0.3–1.2)
Total Protein: 7.7 g/dL (ref 6.5–8.1)

## 2016-04-24 LAB — URINALYSIS, ROUTINE W REFLEX MICROSCOPIC
Bilirubin Urine: NEGATIVE
GLUCOSE, UA: NEGATIVE mg/dL
Ketones, ur: NEGATIVE mg/dL
Nitrite: NEGATIVE
PH: 5 (ref 5.0–8.0)
PROTEIN: 30 mg/dL — AB
Specific Gravity, Urine: 1.028 (ref 1.005–1.030)

## 2016-04-24 LAB — TYPE AND SCREEN
ABO/RH(D): B POS
Antibody Screen: NEGATIVE

## 2016-04-24 LAB — CBC
HEMATOCRIT: 38.7 % (ref 36.0–46.0)
HEMOGLOBIN: 12.9 g/dL (ref 12.0–15.0)
MCH: 29.4 pg (ref 26.0–34.0)
MCHC: 33.3 g/dL (ref 30.0–36.0)
MCV: 88.2 fL (ref 78.0–100.0)
PLATELETS: 289 10*3/uL (ref 150–400)
RBC: 4.39 MIL/uL (ref 3.87–5.11)
RDW: 14.3 % (ref 11.5–15.5)
WBC: 7.8 10*3/uL (ref 4.0–10.5)

## 2016-04-24 LAB — LIPASE, BLOOD: LIPASE: 22 U/L (ref 11–51)

## 2016-04-24 LAB — PREGNANCY, URINE: PREG TEST UR: NEGATIVE

## 2016-04-24 MED ORDER — SODIUM CHLORIDE 0.9 % IV BOLUS (SEPSIS)
1000.0000 mL | Freq: Once | INTRAVENOUS | Status: AC
  Administered 2016-04-25: 1000 mL via INTRAVENOUS

## 2016-04-24 MED ORDER — ONDANSETRON HCL 4 MG/2ML IJ SOLN
4.0000 mg | Freq: Once | INTRAMUSCULAR | Status: AC
  Administered 2016-04-25: 4 mg via INTRAVENOUS
  Filled 2016-04-24: qty 2

## 2016-04-24 MED ORDER — KETOROLAC TROMETHAMINE 30 MG/ML IJ SOLN
30.0000 mg | Freq: Once | INTRAMUSCULAR | Status: AC
  Administered 2016-04-25: 30 mg via INTRAVENOUS
  Filled 2016-04-24: qty 1

## 2016-04-24 MED ORDER — ONDANSETRON 4 MG PO TBDP
4.0000 mg | ORAL_TABLET | Freq: Once | ORAL | Status: AC | PRN
  Administered 2016-04-24: 4 mg via ORAL
  Filled 2016-04-24: qty 1
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
## 2016-04-25 ENCOUNTER — Encounter (HOSPITAL_COMMUNITY): Payer: Self-pay | Admitting: Radiology

## 2016-04-25 ENCOUNTER — Emergency Department (HOSPITAL_COMMUNITY): Payer: Self-pay

## 2016-04-25 LAB — ABO/RH: ABO/RH(D): B POS

## 2016-04-25 LAB — POC OCCULT BLOOD, ED: FECAL OCCULT BLD: NEGATIVE

## 2016-04-25 MED ORDER — AMOXICILLIN-POT CLAVULANATE 875-125 MG PO TABS: 1.0000 | ORAL_TABLET | Freq: Two times a day (BID) | ORAL | 0 refills | Status: AC

## 2016-04-25 MED ORDER — IOPAMIDOL (ISOVUE-300) INJECTION 61%
INTRAVENOUS | Status: AC
  Administered 2016-04-25: 100 mL via INTRAVENOUS
  Filled 2016-04-25: qty 100

## 2016-04-25 MED ORDER — POTASSIUM CHLORIDE CRYS ER 20 MEQ PO TBCR
40.0000 meq | EXTENDED_RELEASE_TABLET | Freq: Once | ORAL | Status: AC
  Administered 2016-04-25: 40 meq via ORAL
  Filled 2016-04-25: qty 2
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
## 2019-09-24 DEATH — deceased
